# Patient Record
Sex: Male | Born: 1940 | Race: White | Hispanic: No | Smoking: Former smoker
Health system: Southern US, Community
[De-identification: ages and names within clinical notes are randomized; demographics above are authoritative.]

## PROBLEM LIST (undated history)

## (undated) DIAGNOSIS — E119 Type 2 diabetes mellitus without complications: Secondary | ICD-10-CM

## (undated) DIAGNOSIS — G2581 Restless legs syndrome: Secondary | ICD-10-CM

## (undated) DIAGNOSIS — G40909 Epilepsy, unspecified, not intractable, without status epilepticus: Secondary | ICD-10-CM

## (undated) DIAGNOSIS — J189 Pneumonia, unspecified organism: Secondary | ICD-10-CM

## (undated) DIAGNOSIS — I340 Nonrheumatic mitral (valve) insufficiency: Secondary | ICD-10-CM

## (undated) DIAGNOSIS — I4819 Other persistent atrial fibrillation: Secondary | ICD-10-CM

## (undated) DIAGNOSIS — I2510 Atherosclerotic heart disease of native coronary artery without angina pectoris: Secondary | ICD-10-CM

## (undated) DIAGNOSIS — I495 Sick sinus syndrome: Secondary | ICD-10-CM

## (undated) DIAGNOSIS — N390 Urinary tract infection, site not specified: Secondary | ICD-10-CM

## (undated) HISTORY — DX: Nonrheumatic mitral (valve) insufficiency: I34.0

## (undated) HISTORY — PX: REVISION UROSTOMY CUTANEOUS: SUR1282

## (undated) HISTORY — DX: Restless legs syndrome: G25.81

## (undated) HISTORY — DX: Pneumonia, unspecified organism: J18.9

## (undated) HISTORY — PX: OTHER SURGICAL HISTORY: SHX169

## (undated) HISTORY — DX: Urinary tract infection, site not specified: N39.0

## (undated) HISTORY — DX: Sick sinus syndrome: I49.5

## (undated) HISTORY — DX: Atherosclerotic heart disease of native coronary artery without angina pectoris: I25.10

## (undated) HISTORY — DX: Other persistent atrial fibrillation: I48.19

## (undated) HISTORY — DX: Epilepsy, unspecified, not intractable, without status epilepticus: G40.909

## (undated) HISTORY — DX: Type 2 diabetes mellitus without complications: E11.9

## (undated) HISTORY — PX: APPENDECTOMY: SHX54

## (undated) HISTORY — PX: CHOLECYSTECTOMY: SHX55

---

## 2008-06-19 ENCOUNTER — Encounter: Payer: Self-pay | Admitting: Physician Assistant

## 2008-06-19 ENCOUNTER — Ambulatory Visit: Payer: Self-pay | Admitting: Cardiology

## 2010-03-02 ENCOUNTER — Encounter: Payer: Self-pay | Admitting: Cardiology

## 2010-05-14 ENCOUNTER — Encounter: Payer: Self-pay | Admitting: Cardiology

## 2010-06-09 ENCOUNTER — Encounter: Payer: Self-pay | Admitting: Cardiology

## 2010-06-10 ENCOUNTER — Encounter: Payer: Self-pay | Admitting: Cardiology

## 2010-06-18 NOTE — Letter (Signed)
Summary: [name] - OFFICE NOTE  [name] - OFFICE NOTE   Imported By: Claudette Laws 05/26/2010 11:24:08  _____________________________________________________________________  External Attachment:    Type:   Image     Comment:   External Document

## 2010-06-18 NOTE — Letter (Signed)
Summary: [facility] - OFFICE NOTE  [facility] - OFF[facility]   Imported By: Claudette Laws 05/26/2010 11:45:55  _____________________________________________________________________  External Attachment:    Type:   Image     Comment:   External Document

## 2010-06-26 ENCOUNTER — Encounter (INDEPENDENT_AMBULATORY_CARE_PROVIDER_SITE_OTHER): Payer: Medicare Other

## 2010-06-26 ENCOUNTER — Ambulatory Visit (INDEPENDENT_AMBULATORY_CARE_PROVIDER_SITE_OTHER): Payer: Medicare Other | Admitting: Cardiology

## 2010-06-26 ENCOUNTER — Encounter: Payer: Self-pay | Admitting: Cardiology

## 2010-06-26 DIAGNOSIS — I10 Essential (primary) hypertension: Secondary | ICD-10-CM | POA: Insufficient documentation

## 2010-06-26 DIAGNOSIS — E785 Hyperlipidemia, unspecified: Secondary | ICD-10-CM | POA: Insufficient documentation

## 2010-06-26 DIAGNOSIS — Z950 Presence of cardiac pacemaker: Secondary | ICD-10-CM | POA: Insufficient documentation

## 2010-06-26 DIAGNOSIS — I4891 Unspecified atrial fibrillation: Secondary | ICD-10-CM | POA: Insufficient documentation

## 2010-06-26 DIAGNOSIS — I2510 Atherosclerotic heart disease of native coronary artery without angina pectoris: Secondary | ICD-10-CM

## 2010-06-26 DIAGNOSIS — R001 Bradycardia, unspecified: Secondary | ICD-10-CM | POA: Insufficient documentation

## 2010-06-26 DIAGNOSIS — I495 Sick sinus syndrome: Secondary | ICD-10-CM

## 2010-07-02 NOTE — Assessment & Plan Note (Signed)
Summary: NP-WANTING TO ESTABLISH W/NEW CARDIOLOGIST   Visit Type:  New patient visit Primary Provider:  Dr. Lia Hopping   History of Present Illness: 70 year old male presents to establish cardiology followup. He was previously seen by Dr. Hyacinth Meeker in [place] with history of paroxysmal atrial fibrillation managed with sotalol and Coumadin, sick sinus syndrome status post St. Jude pacemaker placement 2003, and nonobstructive CAD.  Records indicate that he was recently admitted at [facility] with an episode of paroxysmal atrial fibrillation associated with rapid ventricular response. He reportedly converted with rate control on Cardizem infusion. He had been off Coumadin for quite a while, however it was resumed and he has been on it for about a week at this point. No followup PT/INR as yet. He was also placed on much higher dose Cardizem orally.  [name] is fairly loquacious. He discussed a variety of issues regarding his health care over the last several years. It seems that he has had trouble tolerating medications in the past, specifically high-dose Cardizem, also reportedly had some trouble with Coumadin when he was taking "very high doses" and had to stop. He reports an ill-defined history of "congestive heart failure" in the past, although a recent echocardiogram done in January at [facility] is reviewed below showing normal LVEF.  He describes himself as very active, doing martial arts on a regular basis, exercising. He denies any exertional chest pain. He does report palpitations, usually on at least a once a month basis, however they are not typically prolonged enough to seek hospitalization.  He has had device interrogations done in [place], although is requesting establishment with our practice going forward, including Coumadin followup. He has been on sotalol for many years.  Preventive Screening-Counseling & Management  Alcohol-Tobacco     Smoking Status: quit     Year Started:  1949     Year Quit: 1972     Pack years: 1-1&1/2 PPD 22years  Current Medications (verified): 1)  Requip 1 Mg Tabs (Ropinirole Hcl) .... Take 1 Tablet By Mouth Once A Day 2)  Promethazine Hcl 25 Mg Tabs (Promethazine Hcl) .... As Needed 3)  Zolpidem Tartrate 10 Mg Tabs (Zolpidem Tartrate) .... Take 1 Tablet By Mouth Once A Day At [facility] 4)  Metformin Hcl 1000 Mg Tabs (Metformin Hcl) .... Take 1 Tablet By Mouth Two Times A Day 5)  Furosemide 40 Mg Tabs (Furosemide) .... Take 1 Tablet By Mouth Once A Day 6)  Cardizem Cd 300 Mg Xr24h-Cap (Diltiazem Hcl Coated Beads) .... Take 1 Tablet By Mouth Once A Day 7)  Betapace 120 Mg Tabs (Sotalol Hcl) .... Take 1 Tablet By Mouth Two Times A Day 8)  Mestinon 180 Mg Cr-Tabs (Pyridostigmine Bromide) .... Take 1 Tablet By Mouth Two Times A Day 9)  Carbamazepine 200 Mg Xr12h-Cap (Carbamazepine) .... Take 1 Tablet By Mouth Two Times A Day 10)  Glipizide Xl 5 Mg Xr24h-Tab (Glipizide) .... Take 1 Tablet By Mouth Three Times A Day 11)  Coumadin 5 Mg Tabs (Warfarin Sodium) .... Take 1 Tablet By Mouth Once A Day 12)  Tricor 145 Mg Tabs (Fenofibrate) .... Take 1 Tablet By Mouth Once A Day 13)  B Complex  Tabs (B Complex Vitamins) .... Take 1 Tablet By Mouth Two Times A Day  Allergies (verified): 1)  ! Morphine  Comments:  Nurse/Medical Assistant: The patient's medication list and allergies were reviewed with the patient and were updated in the Medication and Allergy Lists.  Past History:  Family History: Last  updated: 06/26/2010 Father: died suddenly age 78 presumed MI, history of mustard gas exposure Mother: died age 41 intestinal cancer Brother: status post CABG  Social History: Last updated: 06/26/2010 Lives in [place] Tobacco Use - Former.  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 Jude Identity dual chamber pacemaker (DDDR) 4/03 CAD - nonobstructive, catheterization [facility] 2006 Diabetes Type 2 Mild mitral regurgiation History of pneumonia and pneumothorax History of recurrent UTI  Past Surgical History: Appendectomy Cholecystectomy Bladder cancer resection  Family History: Father: died suddenly age 48 presumed MI, history of mustard gas exposure Mother: died age 53 intestinal cancer Brother: status post CABG  Social History: Lives in [place] Tobacco Use - Former.  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         No reported bleeding problems. Otherwise reviewed and negative except as outlined.  Vital Signs:  Patient profile:   70 year old male Height:      71 inches Weight:      232 pounds BMI:     32.47 Pulse rate:   76 / minute BP sitting:   152 / 83  (left arm) Cuff size:   large  Vitals Entered By: Carlye Grippe (June 26, 2010 2:29 PM)  Nutrition Counseling: Patient's BMI is greater than 25 and therefore counseled on weight management options.  Physical Exam  Additional Exam:  Obese male in no acute distress. HEENT: Conjunctiva and lids grossly normal with dysconjugate gaze, oropharynx with poor dentition. Neck: Supple, no elevated JVP or carotid bruits. Lungs: Clear but diminished breath sounds, nonlabored. Cardiac: Regular rate and rhythm, no S3 or significant murmur. Thorax: Well-healed device pocket site left. Abdomen: Soft, protuberant, nontender, bowel sounds present. Skin: Warm and dry. Musculoskeletal: No kyphosis. Extremities: No pitting edema, distal pulses full. Neuropsychiatric: Alert  and oriented x3, affect grossly appropriate.   Echocardiogram  Procedure date:  06/10/2010  Findings:      Normal LV chamber size with LVEF 60%, no focal wall motion abnormalities, aortic valve sclerosis, no other significant valvular abnormalities.  EKG  Procedure date:  06/26/2010  Findings:      Sinus rhythm at 72 beats per minutes with nonspecific T-wave changes, QTC 413 ms.  Impression & Recommendations:  Problem # 1:  ATRIAL FIBRILLATION (ICD-427.31)  Apparent long-standing history of paroxysmal atrial fibrillation, managed over the years by sotalol, previously followed by Dr. Hyacinth Meeker in [place]. Patient has been recently placed back on Coumadin, and would like to establish in our Coumadin clinic. This is being arranged today as he needs a PT INR checked. He does not report any bleeding problems at this point. QTC is normal on sotalol, and no dose adjustments will be made at this time. Depending on how frequently he manifests rapid atrial fibrillation, we may need to consider further medication adjustments versus change in antiarrhythmic therapy. He is also concerned about taking high-dose Cardizem CD, but is willing to give it a try over the next few weeks. This was initiated recently by Dr. Olena Leatherwood.  If he does  not tolerate the dose, relating progressive fatigue which he had experienced in the past, we can certainly cut it back and on  the low end. Routine follow up is scheduled for 3 months, sooner if needed.  His updated medication list for this problem includes:    Betapace 120 Mg Tabs (Sotalol hcl) .Marland Kitchen... Take 1 tablet by mouth two times a day    Coumadin 5 Mg Tabs (Warfarin sodium) .Marland Kitchen... Take 1 tablet by mouth once a day  Problem # 2:  SICK SINUS SYNDROME (ICD-427.81)  Reportedly status post St. Jude dual chamber pacemaker placement in 2003, also in [place]. He would like to establish in our device clinic and this will be arranged with ongoing follow up with Dr.  Johney Frame.  His updated medication list for this problem includes:    Cardizem Cd 300 Mg Xr24h-cap (Diltiazem hcl coated beads) .Marland Kitchen... Take 1 tablet by mouth once a day    Betapace 120 Mg Tabs (Sotalol hcl) .Marland Kitchen... Take 1 tablet by mouth two times a day    Coumadin 5 Mg Tabs (Warfarin sodium) .Marland Kitchen... Take 1 tablet by mouth once a day  His updated medication list for this problem includes:    Cardizem Cd 300 Mg Xr24h-cap (Diltiazem hcl coated beads) .Marland Kitchen... Take 1 tablet by mouth once a day    Betapace 120 Mg Tabs (Sotalol hcl) .Marland Kitchen... Take 1 tablet by mouth two times a day    Coumadin 5 Mg Tabs (Warfarin sodium) .Marland Kitchen... Take 1 tablet by mouth once a day  Problem # 3:  CARDIAC PACEMAKER IN SITU (ICD-V45.01)  Reportedly has St. Jude Identity dual-chamber pacemaker.  Problem # 4:  CORONARY ATHEROSCLEROSIS NATIVE CORONARY ARTERY (ICD-414.01)  Mild, nonobstructive at catheterization in [place] back in 2006. No active angina.  His updated medication list for this problem includes:    Cardizem Cd 300 Mg Xr24h-cap (Diltiazem hcl coated beads) .Marland Kitchen... Take 1 tablet by mouth once a day    Betapace 120 Mg Tabs (Sotalol hcl) .Marland Kitchen... Take 1 tablet by mouth two times a day    Coumadin 5 Mg Tabs (Warfarin sodium) .Marland Kitchen... Take 1 tablet by mouth once a day  His updated medication list for this problem includes:    Cardizem Cd 300 Mg Xr24h-cap (Diltiazem hcl coated beads) .Marland Kitchen... Take 1 tablet by mouth once a day    Betapace 120 Mg Tabs (Sotalol hcl) .Marland Kitchen... Take 1 tablet by mouth two times a day    Coumadin 5 Mg Tabs (Warfarin sodium) .Marland Kitchen... Take 1 tablet by mouth once a day  Problem # 5:  ESSENTIAL HYPERTENSION, BENIGN (ICD-401.1)  Blood pressure elevated today. Continue regular followup with Dr. Olena Leatherwood. Sodium restriction discussed.  His updated medication list for this problem includes:    Furosemide 40 Mg Tabs (Furosemide) .Marland Kitchen... Take 1 tablet by mouth once a day    Cardizem Cd 300 Mg Xr24h-cap (Diltiazem hcl coated  beads) .Marland Kitchen... Take 1 tablet by mouth once a day    Betapace 120 Mg Tabs (Sotalol hcl) .Marland Kitchen... Take 1 tablet by mouth two times a day  Other Orders: EKG w/ Interpretation (93000)  Patient Instructions: 1)  Follow up with Dr. Diona Browner on Friday, Sep 25, 2010 at 1pm.  2)  Your physician recommends that you continue on your current medications as directed. Please refer to the Current Medication list given to you today. Contact our office if you have problems with your medications. 3)  Return on Friday Sep 18, 2010 at 2pm to  establish with Dr. Johney Frame in our pacer clinic.

## 2010-07-02 NOTE — Consult Note (Signed)
Summary: CARDIOLOGY CONSULT/[facility]  [facility]OLOGY CONSULT/[facility]   Imported By: Zachary George 06/26/2010 09:25:55  _____________________________________________________________________  External Attachment:    Type:   Image     Comment:   External Document

## 2010-07-02 NOTE — Letter (Signed)
Summary: Discharge [facility]  Discharge [facility]   Imported By: Dorise Hiss 06/26/2010 09:34:24  _____________________________________________________________________  External Attachment:    Type:   Image     Comment:   External Document

## 2010-07-03 ENCOUNTER — Encounter (INDEPENDENT_AMBULATORY_CARE_PROVIDER_SITE_OTHER): Payer: Medicare Other

## 2010-07-03 ENCOUNTER — Encounter: Payer: Self-pay | Admitting: Cardiology

## 2010-07-03 DIAGNOSIS — I4891 Unspecified atrial fibrillation: Secondary | ICD-10-CM

## 2010-07-03 DIAGNOSIS — Z7901 Long term (current) use of anticoagulants: Secondary | ICD-10-CM

## 2010-07-08 NOTE — Letter (Signed)
Summary: Internal Other/ PATIENT HISTORY FORM  Internal Other/ PATIENT HISTORY FORM   Imported By: Dorise Hiss 07/02/2010 15:38:27  _____________________________________________________________________  External Attachment:    Type:   Image     Comment:   External Document

## 2010-07-08 NOTE — Medication Information (Signed)
Summary: ccr-lr  Anticoagulant Therapy  Managed by: Vashti Hey RN PCP: Gabrielle Dare MD: Myrtis Ser MD, Tinnie Gens Indication 1: Atrial Fibrillation Lab Used: [facility] [place] Site: [place] INR POC 1.8  Dietary changes: no    Health status changes: no    Bleeding/hemorrhagic complications: no    Recent/future hospitalizations: no    Any changes in medication regimen? no    Recent/future dental: no  Any missed doses?: no       Is patient compliant with meds? yes       Allergies: 1)  ! Morphine  Anticoagulation Management History:      The patient is taking warfarin and comes in today for a routine follow up visit.  Positive risk factors for bleeding include an age of 72 years or older and presence of serious comorbidities.  The bleeding index is 'intermediate risk'.  Positive CHADS2 values include History of HTN.  Negative CHADS2 values include Age > 45 years old.  Anticoagulation responsible provider: Myrtis Ser MD, Tinnie Gens.  INR POC: 1.8.  Cuvette Lot#: [id number].    Anticoagulation Management Assessment/Plan:      The patient's current anticoagulation dose is Coumadin 5 mg tabs: Take 1 tablet by mouth once a day.  The target INR is 2.0-3.0.  The next INR is due 07/10/2010.  Anticoagulation instructions were given to patient.  Results were reviewed/authorized by Vashti Hey RN.  He was notified by Vashti Hey RN.         Prior Anticoagulation Instructions: INR 1.2 Increase coumadin 7.5mg  once daily   Current Anticoagulation Instructions: INR 1.8 Increase coumadin to 7.5mg  once daily except 10mg  on Tuesdays and Fridays

## 2010-07-08 NOTE — Medication Information (Signed)
Summary: EST IN OUR COUMADIN CLINIC. STARTED COUMADIN LAST WEEK-JM  Lab Visit  Orders Today:  Anticoagulant Therapy  Managed by: Vashti Hey RN PCP: Gabrielle Dare MD: Diona Browner MD, Remi Deter Indication 1: Atrial Fibrillation Lab Used: [facility] [place] Site: [place] INR POC 1.2  Dietary changes: no    Health status changes: no     Recent/future hospitalizations: yes       Details: In [facility] 1/24 - 1/27 for atrial fib  Any changes in medication regimen? yes       Details: Discharged on coumadin 5mg  qd   Recent/future dental: no  Any missed doses?: no       Is patient compliant with meds? yes         Anticoagulation Management History:      The patient is taking warfarin and comes in today for a routine follow up visit.  Positive risk factors for bleeding include an age of 29 years or older and presence of serious comorbidities.  The bleeding index is 'intermediate risk'.  Positive CHADS2 values include History of HTN and History of Diabetes.  Negative CHADS2 values include Age > 56 years old.  Anticoagulation responsible provider: Diona Browner MD, Remi Deter.  INR POC: 1.2.  Cuvette Lot#: [id number].    Anticoagulation Management Assessment/Plan:      The patient's current anticoagulation dose is Coumadin 5 mg tabs: Take 1 tablet by mouth once a day.  The target INR is 2.0-3.0.  The next INR is due 07/03/2010.  Anticoagulation instructions were given to patient.  Results were reviewed/authorized by Vashti Hey RN.  He was notified by Vashti Hey RN.        Coagulation management information includes: New post hospital  D/C home on coumadin 5mg  once daily   Started on 06/15/10.  Current Anticoagulation Instructions: INR 1.2 Increase coumadin 7.5mg  once daily

## 2010-07-10 ENCOUNTER — Encounter (INDEPENDENT_AMBULATORY_CARE_PROVIDER_SITE_OTHER): Payer: Medicare Other

## 2010-07-10 ENCOUNTER — Encounter: Payer: Self-pay | Admitting: Cardiology

## 2010-07-10 ENCOUNTER — Telehealth: Payer: Self-pay | Admitting: Cardiology

## 2010-07-10 DIAGNOSIS — I4891 Unspecified atrial fibrillation: Secondary | ICD-10-CM

## 2010-07-10 DIAGNOSIS — Z7901 Long term (current) use of anticoagulants: Secondary | ICD-10-CM

## 2010-07-10 LAB — CONVERTED CEMR LAB: POC INR: 2.3

## 2010-07-14 NOTE — Medication Information (Signed)
Summary: ccr-lr  Anticoagulant Therapy  Managed by: Vashti Hey RN PCP: Gabrielle Dare MD: Diona Browner MD, Remi Deter Indication 1: Atrial Fibrillation Lab Used: [facility] [place] Site: [place] INR POC 2.3  Dietary changes: no    Health status changes: no    Bleeding/hemorrhagic complications: no    Recent/future hospitalizations: no    Any changes in medication regimen? no    Recent/future dental: no  Any missed doses?: no       Is patient compliant with meds? yes       Allergies: 1)  ! Morphine  Anticoagulation Management History:      The patient is taking warfarin and comes in today for a routine follow up visit.  Positive risk factors for bleeding include an age of 72 years or older and presence of serious comorbidities.  The bleeding index is 'intermediate risk'.  Positive CHADS2 values include History of HTN.  Negative CHADS2 values include Age > 72 years old.  Anticoagulation responsible provider: Diona Browner MD, Remi Deter.  INR POC: 2.3.  Cuvette Lot#: [id number].    Anticoagulation Management Assessment/Plan:      The patient's current anticoagulation dose is Coumadin 5 mg tabs: Take 1 tablet by mouth once a day.  The target INR is 2.0-3.0.  The next INR is due 07/21/2010.  Anticoagulation instructions were given to patient.  Results were reviewed/authorized by Vashti Hey RN.  He was notified by Vashti Hey RN.         Prior Anticoagulation Instructions: INR 1.8 Increase coumadin to 7.5mg  once daily except 10mg  on Tuesdays and Fridays  Current Anticoagulation Instructions: INR 2.3 Continue coumadin 7.5mg  once daily except 10mg  on Tuesdays and Fridays

## 2010-07-14 NOTE — Progress Notes (Signed)
Summary: Palps & not feeling well  Phone Note Call from Patient Call back at Home Phone [phone]   Caller: Patient Reason for Call: Talk to Nurse Summary of Call: patient is here in clinic to have ccr check and complaints of PALPS with not feeling well...  Initial call taken by: Claudette Laws,  July 10, 2010 10:00 AM  Follow-up for Phone Call        Pt states heart went out of rhythm last night. He didn't go to ER b/c he knew he was coming to office today. He states his heart does this all the time and he knows he is just going into a.fib. He states he felt like going on extended release Cardizem would help as he was previously on 30mg  two times a day. Pt states he is having chest pressure/pain and some SOB. Offered to do EKG. Pt refused. Notified pt he should go to ER for active chest pressure/palps. Pt refused this as well. He states he will go home. He states it usually converts on its own. If it doesn't, he states he will go to ER later but he doesn't feel like going down there and sitting right now. Follow-up by: Cyril Loosen, RN, BSN,  July 10, 2010 10:06 AM  Additional Follow-up for Phone Call Additional follow up Details #1::        Noted. Patient gone by the time I received this. Additional Follow-up by: Loreli Slot, MD, [facility],  July 10, 2010 10:26 AM

## 2010-07-21 ENCOUNTER — Encounter (INDEPENDENT_AMBULATORY_CARE_PROVIDER_SITE_OTHER): Payer: Medicare Other

## 2010-07-21 ENCOUNTER — Encounter: Payer: Self-pay | Admitting: Cardiology

## 2010-07-21 DIAGNOSIS — Z7901 Long term (current) use of anticoagulants: Secondary | ICD-10-CM

## 2010-07-21 DIAGNOSIS — I4891 Unspecified atrial fibrillation: Secondary | ICD-10-CM

## 2010-07-21 LAB — CONVERTED CEMR LAB: POC INR: 4.4

## 2010-07-28 ENCOUNTER — Encounter (INDEPENDENT_AMBULATORY_CARE_PROVIDER_SITE_OTHER): Payer: Medicare Other

## 2010-07-28 ENCOUNTER — Encounter: Payer: Self-pay | Admitting: Cardiology

## 2010-07-28 DIAGNOSIS — I4891 Unspecified atrial fibrillation: Secondary | ICD-10-CM

## 2010-07-28 DIAGNOSIS — Z7901 Long term (current) use of anticoagulants: Secondary | ICD-10-CM

## 2010-07-28 NOTE — Medication Information (Signed)
Summary: ccr-lr  Anticoagulant Therapy  Managed by: Vashti Hey RN PCP: Gabrielle Dare MD: Andee Lineman MD, Michelle Piper Indication 1: Atrial Fibrillation Lab Used: [facility] [place] Site: [place] INR POC 4.4  Dietary changes: no    Health status changes: no    Bleeding/hemorrhagic complications: no    Recent/future hospitalizations: no    Any changes in medication regimen? no    Recent/future dental: no  Any missed doses?: no       Is patient compliant with meds? yes       Allergies: 1)  ! Morphine  Anticoagulation Management History:      The patient is taking warfarin and comes in today for a routine follow up visit.  Positive risk factors for bleeding include an age of 72 years or older and presence of serious comorbidities.  The bleeding index is 'intermediate risk'.  Positive CHADS2 values include History of HTN.  Negative CHADS2 values include Age > 72 years old.  Anticoagulation responsible provider: Andee Lineman MD, Michelle Piper.  INR POC: 4.4.  Cuvette Lot#: [id number].    Anticoagulation Management Assessment/Plan:      The patient's current anticoagulation dose is Coumadin 5 mg tabs: Take 1 tablet by mouth once a day.  The target INR is 2.0-3.0.  The next INR is due 07/28/2010.  Anticoagulation instructions were given to patient.  Results were reviewed/authorized by Vashti Hey RN.  He was notified by Vashti Hey RN.         Prior Anticoagulation Instructions: INR 2.3 Continue coumadin 7.5mg  once daily except 10mg  on Tuesdays and Fridays  Current Anticoagulation Instructions: INR 4.4 Hold coumadin tonight then decrease dose to 7.5mg  once daily except 5mg  on Wednesdays and Saturdays

## 2010-08-04 NOTE — Medication Information (Signed)
Summary: ccr-lr  Anticoagulant Therapy  Managed by: Vashti Hey RN PCP: Gabrielle Dare MD: Antoine Poche MD, Fayrene Fearing Indication 1: Atrial Fibrillation Lab Used: [facility] [place] Site: [place] INR POC 2.2  Dietary changes: no    Health status changes: no    Bleeding/hemorrhagic complications: no    Recent/future hospitalizations: no    Any changes in medication regimen? no    Recent/future dental: no  Any missed doses?: no       Is patient compliant with meds? yes       Allergies: 1)  ! Morphine  Anticoagulation Management History:      The patient is taking warfarin and comes in today for a routine follow up visit.  Positive risk factors for bleeding include an age of 72 years or older and presence of serious comorbidities.  The bleeding index is 'intermediate risk'.  Positive CHADS2 values include History of HTN.  Negative CHADS2 values include Age > 80 years old.  Anticoagulation responsible provider: Antoine Poche MD, Fayrene Fearing.  INR POC: 2.2.  Cuvette Lot#: [id number].    Anticoagulation Management Assessment/Plan:      The patient's current anticoagulation dose is Coumadin 5 mg tabs: Take 1 tablet by mouth once a day.  The target INR is 2.0-3.0.  The next INR is due 08/11/2010.  Anticoagulation instructions were given to patient.  Results were reviewed/authorized by Vashti Hey RN.  He was notified by Vashti Hey RN.         Prior Anticoagulation Instructions: INR 4.4 Hold coumadin tonight then decrease dose to 7.5mg  once daily except 5mg  on Wednesdays and Saturdays  Current Anticoagulation Instructions: INR 2.2 Continue coumadin 7.5mg  except 5mg  on Wednesdays and Saturdays

## 2010-08-08 ENCOUNTER — Encounter: Payer: Self-pay | Admitting: Cardiology

## 2010-08-08 DIAGNOSIS — I4891 Unspecified atrial fibrillation: Secondary | ICD-10-CM

## 2010-08-08 DIAGNOSIS — Z7901 Long term (current) use of anticoagulants: Secondary | ICD-10-CM | POA: Insufficient documentation

## 2010-08-11 ENCOUNTER — Ambulatory Visit (INDEPENDENT_AMBULATORY_CARE_PROVIDER_SITE_OTHER): Payer: Medicare Other | Admitting: *Deleted

## 2010-08-11 DIAGNOSIS — I4891 Unspecified atrial fibrillation: Secondary | ICD-10-CM

## 2010-08-11 DIAGNOSIS — Z7901 Long term (current) use of anticoagulants: Secondary | ICD-10-CM

## 2010-08-28 ENCOUNTER — Encounter: Payer: Self-pay | Admitting: *Deleted

## 2010-08-28 ENCOUNTER — Ambulatory Visit (INDEPENDENT_AMBULATORY_CARE_PROVIDER_SITE_OTHER): Payer: Medicare Other | Admitting: *Deleted

## 2010-08-28 DIAGNOSIS — I4891 Unspecified atrial fibrillation: Secondary | ICD-10-CM

## 2010-08-28 DIAGNOSIS — Z7901 Long term (current) use of anticoagulants: Secondary | ICD-10-CM

## 2010-08-28 LAB — POCT INR: INR: 1.6

## 2010-09-15 ENCOUNTER — Ambulatory Visit (INDEPENDENT_AMBULATORY_CARE_PROVIDER_SITE_OTHER): Payer: Medicare Other | Admitting: *Deleted

## 2010-09-15 DIAGNOSIS — Z7901 Long term (current) use of anticoagulants: Secondary | ICD-10-CM

## 2010-09-15 DIAGNOSIS — I4891 Unspecified atrial fibrillation: Secondary | ICD-10-CM

## 2010-09-15 LAB — POCT INR: INR: 1.1

## 2010-09-18 ENCOUNTER — Encounter: Payer: Self-pay | Admitting: Internal Medicine

## 2010-09-18 ENCOUNTER — Encounter: Payer: Medicare Other | Admitting: Internal Medicine

## 2010-09-18 ENCOUNTER — Encounter: Payer: Medicare Other | Admitting: *Deleted

## 2010-09-24 ENCOUNTER — Encounter: Payer: Self-pay | Admitting: Cardiology

## 2010-09-25 ENCOUNTER — Encounter: Payer: Self-pay | Admitting: Cardiology

## 2010-09-25 ENCOUNTER — Ambulatory Visit (INDEPENDENT_AMBULATORY_CARE_PROVIDER_SITE_OTHER): Payer: Medicare Other | Admitting: Physician Assistant

## 2010-09-25 ENCOUNTER — Ambulatory Visit (INDEPENDENT_AMBULATORY_CARE_PROVIDER_SITE_OTHER): Payer: Medicare Other | Admitting: *Deleted

## 2010-09-25 ENCOUNTER — Encounter: Payer: Self-pay | Admitting: *Deleted

## 2010-09-25 DIAGNOSIS — Z7901 Long term (current) use of anticoagulants: Secondary | ICD-10-CM

## 2010-09-25 DIAGNOSIS — R0989 Other specified symptoms and signs involving the circulatory and respiratory systems: Secondary | ICD-10-CM

## 2010-09-25 DIAGNOSIS — Z950 Presence of cardiac pacemaker: Secondary | ICD-10-CM

## 2010-09-25 DIAGNOSIS — I4891 Unspecified atrial fibrillation: Secondary | ICD-10-CM

## 2010-09-25 DIAGNOSIS — R0600 Dyspnea, unspecified: Secondary | ICD-10-CM

## 2010-09-25 LAB — POCT INR: INR: 1.2

## 2010-09-25 NOTE — Progress Notes (Signed)
Clinical Summary [name] is a 70 y.o.male presenting for followup. He was seen as a new patient in February 2012.  Patient presents complaining of worsening abdominal girth, DOE, and easy fatigability. He was seen in the [facility] ED on May 6, for complaint of abdominal swelling. He was in normal sinus rhythm. Blood pressure 155/68, pulse 90. Potassium 4.6, creatinine 1.3, hemoglobin 1.5. Chest x-ray: No acute disease. CT of the abdomen: Fatty liver; bladder resection with ileal conduit. No significant change from previous study.  Patient is concerned that increased dose of diltiazem 300 mg daily may be contributing to the abdominal swelling. Of note, he was on this dose when last seen in the office, in February. No medication adjustments were made, at time of his recent ED visit.  Patient denies exertional CP. He continues to have occasional palpitations, with associated dyspnea. He denies orthopnea, PND, or worsening of his chronic LLE edema.  Allergies  Allergen Reactions  . Morphine     REACTION: rash    Current outpatient prescriptions:b complex vitamins tablet, Take 1 tablet by mouth 2 (two) times daily.  , Disp: , Rfl: ;  carbamazepine (CARBATROL) 200 MG 12 hr capsule, Take 200 mg by mouth 2 (two) times daily.  , Disp: , Rfl: ;  diltiazem (CARDIZEM CD) 300 MG 24 hr capsule, Take 300 mg by mouth daily.  , Disp: , Rfl: ;  fenofibrate (TRICOR) 145 MG tablet, Take 145 mg by mouth daily.  , Disp: , Rfl:  furosemide (LASIX) 40 MG tablet, Take 40 mg by mouth daily.  , Disp: , Rfl: ;  glipiZIDE (GLUCOTROL) 5 MG 24 hr tablet, Take 5 mg by mouth 3 (three) times daily.  , Disp: , Rfl: ;  metFORMIN (GLUCOPHAGE) 1000 MG tablet, Take 1,000 mg by mouth 2 (two) times daily with a meal.  , Disp: , Rfl: ;  promethazine (PHENERGAN) 25 MG tablet, Take 25 mg by mouth every 6 (six) hours as needed.  , Disp: , Rfl:  pyridostigmine (MESTINON) 180 MG CR tablet, Take 180 mg by mouth 2 (two) times daily.  , Disp: ,  Rfl: ;  rOPINIRole (REQUIP) 1 MG tablet, Take 1 mg by mouth daily.  , Disp: , Rfl: ;  sotalol (BETAPACE) 120 MG tablet, Take 120 mg by mouth 2 (two) times daily.  , Disp: , Rfl: ;  warfarin (COUMADIN) 5 MG tablet, Take by mouth as directed.  , Disp: , Rfl: ;  zolpidem (AMBIEN) 10 MG tablet, Take 10 mg by mouth at bedtime as needed.  , Disp: , Rfl:   Past Medical History  Diagnosis Date  . Paroxysmal atrial fibrillation   . Type 2 diabetes mellitus   . Seizure disorder   . Myasthenia gravis   . Restless legs syndrome (RLS)   . Sick sinus syndrome     St. Jude Identity dual chamber pacemaker (DDDR) 4/03  . Coronary atherosclerosis of native coronary artery     Nonobstructive, catheterization [facility] 2006  . Mitral regurgitation     Mild  . Pneumonia   . Pneumothorax   . Recurrent UTI     Social History [name] reports that he quit smoking about 40 years ago. His smoking use included Cigarettes. He has never used smokeless tobacco. [name] reports that he drinks alcohol.  Review of Systems The patient denies fatigue, malaise, fever, vision loss, decreased hearing, hoarseness, prolonged cough, wheezing, sleep apnea, coughing up blood, abdominal pain, blood in stool, nausea,  vomiting, diarrhea, heartburn, incontinence, blood in urine, muscle weakness, joint pain, rash, skin lesions, headache, fainting, dizziness, depression, anxiety, enlarged lymph nodes, easy bruising or bleeding, and environmental allergies.      Physical Examination There were no vitals filed for this visit. Obese male in no acute distress. HEENT: Conjunctiva and lids grossly normal with dysconjugate gaze, oropharynx with poor dentition. Neck: Supple, no elevated JVP or carotid bruits. Lungs: Clear but diminished breath sounds, nonlabored. Cardiac: Irregularly irregular, no S3 or significant murmur. Thorax: Well-healed device pocket site left. Abdomen: Soft, protuberant, nontender, bowel sounds present.  No fluid wave there Skin: Warm and dry. Musculoskeletal: No kyphosis. Extremities: 1-2+ LLE, non-pitting edema, distal pulses full. Neuropsychiatric: Alert and oriented x3, affect grossly appropriate.   ECG  Atrial fibrillation at 98 bpm; normal axis; nonspecific ST changes   Studies Echocardiogram 06/10/2010: Normal LV chamber size with LVEF 60%, no focal wall motion abnormalities, aortic valve sclerosis, no other significant valvular abnormalities.  Problem List and Plan

## 2010-09-25 NOTE — Assessment & Plan Note (Signed)
Patient complains of exertional dyspnea. There is no evidence of CHF by history or examination. Recent CXR in the ED was negative. He is on maintenance Lasix for his chronic LLE edema. He has normal LVF by recent echocardiogram, 1/12.

## 2010-09-25 NOTE — Patient Instructions (Signed)
Follow up with Dr. Diona Browner as scheduled. Follow up with Dr. Olena Leatherwood Thursday, Oct 01, 2010 at 12:30pm. Your physician recommends that you continue on your current medications as directed. Please refer to the Current Medication list given to you today. Your physician has requested that you have en exercise stress cardiolite. For further information please visit [email or web address] Please follow instruction sheet, as given.

## 2010-09-25 NOTE — Assessment & Plan Note (Signed)
Followup with Dr. Hillis Range here in our device clinic, as scheduled.

## 2010-09-25 NOTE — Assessment & Plan Note (Addendum)
Patient presents in recurrent atrial fibrillation, although he was in NSR on May 7, when he presented to the ED. He has been on Sotalol for a long time, and on increased dose of Cardizem since the beginning of this year. He is on chronic Coumadin, followed in our clinic. Following consultation with Dr. Diona Browner, plan is to screen for ischemic heart disease with an exercise stress Cardiolite. He had nonobstructive CAD by catheterization in 2006 ([place]). If the stress test is negative, we may consider him for flecainide antiarrhythmic therapy. We have also considered Tikosyn as a possible therapeutic option. Will plan early clinic follow up with myself and Dr. Diona Browner in one month, for review of test result and further recommendations. He will need repeat EKG at that time, as well.

## 2010-09-25 NOTE — Assessment & Plan Note (Signed)
An INR level will be checked today, as scheduled.

## 2010-10-01 ENCOUNTER — Encounter: Payer: Self-pay | Admitting: Internal Medicine

## 2010-10-01 ENCOUNTER — Encounter: Payer: Self-pay | Admitting: *Deleted

## 2010-10-01 ENCOUNTER — Ambulatory Visit (INDEPENDENT_AMBULATORY_CARE_PROVIDER_SITE_OTHER): Payer: Medicare Other | Admitting: Internal Medicine

## 2010-10-01 DIAGNOSIS — I4891 Unspecified atrial fibrillation: Secondary | ICD-10-CM

## 2010-10-01 DIAGNOSIS — I2510 Atherosclerotic heart disease of native coronary artery without angina pectoris: Secondary | ICD-10-CM

## 2010-10-01 DIAGNOSIS — I10 Essential (primary) hypertension: Secondary | ICD-10-CM

## 2010-10-01 DIAGNOSIS — Z950 Presence of cardiac pacemaker: Secondary | ICD-10-CM

## 2010-10-01 DIAGNOSIS — I495 Sick sinus syndrome: Secondary | ICD-10-CM

## 2010-10-01 DIAGNOSIS — Z7901 Long term (current) use of anticoagulants: Secondary | ICD-10-CM

## 2010-10-01 NOTE — Assessment & Plan Note (Signed)
Stable No change required today  

## 2010-10-01 NOTE — Patient Instructions (Addendum)
   Follow up as scheduled. Your physician recommends that you go to the [facility] for lab work: DO LABS ON MAY 24 OR 25TH.

## 2010-10-01 NOTE — Assessment & Plan Note (Signed)
Stable We will hold coumadin for 3 days prior to PPM pulse generator replacement.

## 2010-10-01 NOTE — Progress Notes (Signed)
Frederick [name] is a pleasant 70 y.o. WM patient with a h/o bradycardia sp PPM (SJM) by Dr  Hyacinth Meeker in [place] who presents today to establish care in the Electrophysiology device clinic.  He reports having multiple episodes of syncope more than 10 years ago.  He underwent surgery for bladder CA in 2003.  While in the hospital, he was observed to have multiple episodes of sinus arrest with associated syncope.  He reports having his PPM implanted by Dr Hyacinth Meeker at that time.   The patient reports doing very well since having a pacemaker implanted and remains very active despite his age.   Today, he  denies symptoms of palpitations, chest pain, orthopnea, PND, dizziness, presyncope, syncope, or neurologic sequela.  He has stable chronic edema and SOB. The patientis tolerating medications without difficulties and is otherwise without complaint today.   Past Medical History  Diagnosis Date  . Paroxysmal atrial fibrillation   . Type 2 diabetes mellitus   . Seizure disorder   . Myasthenia gravis   . Restless legs syndrome (RLS)   . Sick sinus syndrome     St. Jude Identity dual chamber pacemaker (DDDR) 4/03  . Coronary atherosclerosis of native coronary artery     Nonobstructive, catheterization [facility] 2006  . Mitral regurgitation     Mild  . Pneumonia   . Pneumothorax   . Recurrent UTI     Past Surgical History  Procedure Date  . Appendectomy   . Cholecystectomy   . Bladder cancer resection   . St. jude identity dual chamber pacemaker (dddr) 4/03     04/03 by Dr Hyacinth Meeker in [place]    History   Social History  . Marital Status: Widowed    Spouse Name: N/A    Number of Children: N/A  . Years of Education: N/A   Occupational History  . Retired    Social History Main Topics  . Smoking status: Former Smoker -- 1.5 packs/day for 22 years    Types: Cigarettes    Quit date: 05/17/1970  . Smokeless tobacco: Never Used   Comment:  Pack years: 1-1&1/2 PPD 22years  . Alcohol Use:  Yes     Rare occassions  . Drug Use: No  . Sexually Active: Not on file   Other Topics Concern  . Not on file   Social History Narrative   Lives in [place]    No family history on file.  Allergies  Allergen Reactions  . Morphine     REACTION: rash    Current Outpatient Prescriptions  Medication Sig Dispense Refill  . b complex vitamins tablet Take 1 tablet by mouth 2 (two) times daily.        . carbamazepine (CARBATROL) 200 MG 12 hr capsule Take 200 mg by mouth 2 (two) times daily.        Marland Kitchen diltiazem (CARDIZEM CD) 300 MG 24 hr capsule Take 300 mg by mouth daily.        . fenofibrate (TRICOR) 145 MG tablet Take 145 mg by mouth daily.        . furosemide (LASIX) 40 MG tablet Take 40 mg by mouth daily.       Marland Kitchen glipiZIDE (GLUCOTROL) 5 MG 24 hr tablet Take 5 mg by mouth 3 (three) times daily.        . metFORMIN (GLUCOPHAGE) 1000 MG tablet Take 1,000 mg by mouth 2 (two) times daily with a meal.        . Omega-3  Fatty Acids (FISH OIL) 1000 MG CAPS Take 1 capsule by mouth 2 (two) times daily.        . promethazine (PHENERGAN) 25 MG tablet Take 25 mg by mouth every 6 (six) hours as needed.        . pyridostigmine (MESTINON) 180 MG CR tablet Take 180 mg by mouth 2 (two) times daily.       Marland Kitchen rOPINIRole (REQUIP) 1 MG tablet Take 1 mg by mouth daily.        . sotalol (BETAPACE) 120 MG tablet Take 120 mg by mouth 2 (two) times daily.        Marland Kitchen warfarin (COUMADIN) 5 MG tablet Take by mouth as directed.        . zolpidem (AMBIEN) 10 MG tablet Take 10 mg by mouth at bedtime as needed.          ROS- all systems are reviewed and negative except as per HPI  Physical Exam: Filed Vitals:   10/01/10 1536  BP: 152/80  Pulse: 76  Height: 5\' 11"  (1.803 m)  Weight: 242 lb (109.77 kg)    GEN- The patient is well appearing, alert and oriented x 3 today.   Head- normocephalic, atraumatic Eyes-  Sclera clear, conjunctiva pink Ears- hearing intact Oropharynx- clear Neck- supple, no  JVP Lymph- no cervical lymphadenopathy Lungs- Clear to ausculation bilaterally, normal work of breathing Chest- pacemaker pocket is well healed Heart- Regular rate and rhythm, no murmurs, rubs or gallops, PMI not laterally displaced GI- soft, NT, ND, + BS Extremities- no clubbing, cyanosis, 2+ chronic edema MS- no significant deformity or atrophy Skin- no rash or lesion Psych- euthymic mood, full affect Neuro- strength and sensation are intact  Pacemaker interrogation- reviewed in detail today,  See PACEART report  Assessment and Plan:

## 2010-10-01 NOTE — Assessment & Plan Note (Signed)
He is S/p PPM (SJM) by Dr Hyacinth Meeker 2003 for sinus pauses with syncope.  These episodes appear to have been in the setting of post op recovery from surgery and may have been vagal in origin.  The patient is happy to have had no further syncope since having his pacemaker implanted.  He has now reached ERI, though his device is otherwise functioning appropriately.  He is not device dependant.  Today, we had a long discussion regarding the implications of possible vagally mediated sinus node dysfunction/ pauses post operatively.  Though I cannot guarantee that he would not have syncope without a pacemaker, I am also not completely convinced that he would require one going forward.  We had a long discussion today regarding risks, benefits, and alternatives to pacemaker pulse generator replacement.  The pt is very clear that he would like to have his pulse generator replaced.  I think that this is a very reasonable option.   We will therefore plan PPM pulse generator replacement at the next available time.

## 2010-10-02 ENCOUNTER — Ambulatory Visit (INDEPENDENT_AMBULATORY_CARE_PROVIDER_SITE_OTHER): Payer: Medicare Other | Admitting: *Deleted

## 2010-10-02 DIAGNOSIS — Z7901 Long term (current) use of anticoagulants: Secondary | ICD-10-CM

## 2010-10-02 DIAGNOSIS — I4891 Unspecified atrial fibrillation: Secondary | ICD-10-CM

## 2010-10-02 LAB — POCT INR: INR: 1.7

## 2010-10-05 ENCOUNTER — Telehealth: Payer: Self-pay | Admitting: *Deleted

## 2010-10-05 ENCOUNTER — Other Ambulatory Visit: Payer: Self-pay | Admitting: *Deleted

## 2010-10-05 MED ORDER — WARFARIN SODIUM 5 MG PO TABS: ORAL_TABLET | ORAL | Status: DC

## 2010-10-05 NOTE — Telephone Encounter (Signed)
Checking percert for PPM pulse generator replacement No date set yet per Dr. Johney Frame

## 2010-10-05 NOTE — Telephone Encounter (Signed)
NO PRECERT REQ FOR GENERATOR CHANGEOUT FOR MEDICARE/MEDICAID

## 2010-10-08 ENCOUNTER — Encounter: Payer: Self-pay | Admitting: Internal Medicine

## 2010-10-08 LAB — PROTIME-INR

## 2010-10-13 DIAGNOSIS — R072 Precordial pain: Secondary | ICD-10-CM

## 2010-10-15 ENCOUNTER — Ambulatory Visit (HOSPITAL_COMMUNITY)
Admission: RE | Admit: 2010-10-15 | Discharge: 2010-10-15 | Disposition: A | Discharge status: Home / Self Care | Payer: Medicare Other | Source: Ambulatory Visit | Attending: Internal Medicine | Admitting: Internal Medicine

## 2010-10-15 DIAGNOSIS — I4891 Unspecified atrial fibrillation: Secondary | ICD-10-CM | POA: Insufficient documentation

## 2010-10-15 DIAGNOSIS — I10 Essential (primary) hypertension: Secondary | ICD-10-CM | POA: Insufficient documentation

## 2010-10-15 DIAGNOSIS — I2510 Atherosclerotic heart disease of native coronary artery without angina pectoris: Secondary | ICD-10-CM | POA: Insufficient documentation

## 2010-10-15 DIAGNOSIS — Z45018 Encounter for adjustment and management of other part of cardiac pacemaker: Secondary | ICD-10-CM | POA: Insufficient documentation

## 2010-10-15 DIAGNOSIS — I495 Sick sinus syndrome: Secondary | ICD-10-CM

## 2010-10-15 LAB — GLUCOSE, CAPILLARY: Glucose-Capillary: 199 mg/dL — ABNORMAL HIGH (ref 70–99)

## 2010-10-15 LAB — PROTIME-INR
INR: 1.1 (ref 0.00–1.49)
Prothrombin Time: 14.4 seconds (ref 11.6–15.2)

## 2010-10-15 LAB — SURGICAL PCR SCREEN
MRSA, PCR: NEGATIVE
Staphylococcus aureus: NEGATIVE

## 2010-10-20 ENCOUNTER — Ambulatory Visit (INDEPENDENT_AMBULATORY_CARE_PROVIDER_SITE_OTHER): Payer: Medicare Other | Admitting: *Deleted

## 2010-10-20 DIAGNOSIS — I4891 Unspecified atrial fibrillation: Secondary | ICD-10-CM

## 2010-10-20 DIAGNOSIS — Z7901 Long term (current) use of anticoagulants: Secondary | ICD-10-CM

## 2010-10-22 ENCOUNTER — Encounter: Payer: Self-pay | Admitting: Internal Medicine

## 2010-10-22 ENCOUNTER — Ambulatory Visit (INDEPENDENT_AMBULATORY_CARE_PROVIDER_SITE_OTHER): Payer: Medicare Other | Admitting: *Deleted

## 2010-10-22 VITALS — BP 151/82 | HR 78 | Ht 71.0 in | Wt 238.0 lb

## 2010-10-22 DIAGNOSIS — I495 Sick sinus syndrome: Secondary | ICD-10-CM

## 2010-10-22 NOTE — Progress Notes (Signed)
Wound check pacer 

## 2010-10-26 ENCOUNTER — Encounter: Payer: Self-pay | Admitting: *Deleted

## 2010-10-26 ENCOUNTER — Encounter: Payer: Self-pay | Admitting: Cardiology

## 2010-10-26 ENCOUNTER — Ambulatory Visit (INDEPENDENT_AMBULATORY_CARE_PROVIDER_SITE_OTHER): Payer: Medicare Other | Admitting: Cardiology

## 2010-10-26 ENCOUNTER — Telehealth: Payer: Self-pay | Admitting: *Deleted

## 2010-10-26 VITALS — BP 164/82 | HR 85 | Ht 71.0 in | Wt 239.0 lb

## 2010-10-26 DIAGNOSIS — I4891 Unspecified atrial fibrillation: Secondary | ICD-10-CM

## 2010-10-26 DIAGNOSIS — Z950 Presence of cardiac pacemaker: Secondary | ICD-10-CM

## 2010-10-26 DIAGNOSIS — I2510 Atherosclerotic heart disease of native coronary artery without angina pectoris: Secondary | ICD-10-CM

## 2010-10-26 DIAGNOSIS — R0602 Shortness of breath: Secondary | ICD-10-CM

## 2010-10-26 DIAGNOSIS — R9430 Abnormal result of cardiovascular function study, unspecified: Secondary | ICD-10-CM

## 2010-10-26 NOTE — Assessment & Plan Note (Signed)
Reportedly nonobstructive as of 2006, based on cardiac catheterization with Dr. Hyacinth Meeker in [place]. Further assessment pending to exclude significant progression.

## 2010-10-26 NOTE — Assessment & Plan Note (Signed)
Recent St. Jude generator change by Dr. Johney Frame in late May. Patient will need to maintain regular followup for device interrogation.

## 2010-10-26 NOTE — Telephone Encounter (Signed)
Pt has Medicare and Medicaid, no precert required.  Frederick [name]

## 2010-10-26 NOTE — Progress Notes (Signed)
Clinical Summary [name] is a 70 y.o.male presenting for followup. He was seen recently by [name] in mid-May at which time a Cardiolite was arranged for ischemic surveillance. He then followed up with Dr. Johney [name] and underwent discussion regarding pacemaker generator replacement given ERI. Records indicate that this was accomplished on May 31. A. St. Jude Accent model was placed. Cardiolite results are noted below.  Today I discussed the Cardiolite results with [name], as well as the implications. He is quite loquacious, tending to monopolize the conversation, and changing topics. He complains of dyspnea on exertion that is  functionally limiting, although no frank angina. Cardiac catheterization from 2006 reportedly revealed no major obstructive stenoses. The possibility of progressive CAD is to be considered in light of his Cardiolite findings, although he may be limited by frequent episodes of paroxysmal atrial fibrillation as well.  He does complain of rapid palpitations, and continues to voice concerns about diltiazem at the present dose, feeling that it makes him "feel bad." He reports compliance with his Coumadin. States he recently underwent an esophageal dilatation procedure in [name], and Coumadin had to be held. His most recent INR was 1.0 on June 5.  He also states that he thinks he can "feel" his pacemaker when it functions. He reportedly had problems with this in the past with Dr. Hyacinth [name] in [place].  Today I outlined a number of options as best I could with [name]. He is not satisfied with his current symptom management. We discussed proceeding with a diagnostic cardiac catheterization, off Coumadin, to best understand his coronary anatomy and rule out significant progression of CAD that may be symptom provoking. Alternatively we could manage him medically for CAD and consider a change in antiarrhythmic therapy from sotalol. Another option would be to adopt a strategy of  heart rate control, continuing Coumadin, and discontinue sotalol altogether. After reviewing these options, and discussing the risks and potential benefits, plan is to arrange a cardiac catheterization as noted, and then depending on the results, discuss a different antiarrhythmic agent with switch from sotalol. He voiced the most comfort with this option.   Allergies  Allergen Reactions  . Morphine     REACTION: rash    Current outpatient prescriptions:b complex vitamins tablet, Take 1 tablet by mouth 2 (two) times daily.  , Disp: , Rfl: ;  carbamazepine (CARBATROL) 200 MG 12 hr capsule, Take 200 mg by mouth 2 (two) times daily.  , Disp: , Rfl: ;  diltiazem (CARDIZEM CD) 300 MG 24 hr capsule, Take 300 mg by mouth daily.  , Disp: , Rfl: ;  fenofibrate (TRICOR) 145 MG tablet, Take 145 mg by mouth daily.  , Disp: , Rfl:  furosemide (LASIX) 40 MG tablet, Take 40 mg by mouth daily. , Disp: , Rfl: ;  glipiZIDE (GLUCOTROL) 5 MG 24 hr tablet, Take 5 mg by mouth 3 (three) times daily.  , Disp: , Rfl: ;  metFORMIN (GLUCOPHAGE) 1000 MG tablet, Take 1,000 mg by mouth 2 (two) times daily with a meal.  , Disp: , Rfl: ;  nystatin-triamcinolone (MYCOLOG II) cream, Apply 1 application topically 2 (two) times daily as needed.  , Disp: , Rfl:  Omega-3 Fatty Acids (FISH OIL) 1000 MG CAPS, Take 1 capsule by mouth 2 (two) times daily.  , Disp: , Rfl: ;  promethazine (PHENERGAN) 25 MG tablet, Take 25 mg by mouth every 6 (six) hours as needed.  , Disp: , Rfl: ;  pyridostigmine (MESTINON) 180 MG  CR tablet, Take 180 mg by mouth 2 (two) times daily. , Disp: , Rfl: ;  rOPINIRole (REQUIP) 1 MG tablet, Take 1 mg by mouth daily.  , Disp: , Rfl:  sotalol (BETAPACE) 120 MG tablet, Take 120 mg by mouth 2 (two) times daily.  , Disp: , Rfl: ;  warfarin (COUMADIN) 5 MG tablet, Take coumadin 1 1/2 tablets daily except 1 tablet on Mondays and Thursdays, Disp: 45 tablet, Rfl: 3;  zolpidem (AMBIEN) 10 MG tablet, Take 10 mg by mouth at bedtime  as needed.  , Disp: , Rfl:   Past Medical History  Diagnosis Date  . Paroxysmal atrial fibrillation   . Type 2 diabetes mellitus   . Seizure disorder   . Myasthenia gravis   . Restless legs syndrome (RLS)   . Sick sinus syndrome     St. Jude Identity dual chamber pacemaker (DDDR) 4/03  . Coronary atherosclerosis of native coronary artery     Nonobstructive, catheterization [facility] 2006  . Mitral regurgitation     Mild  . Pneumonia   . Pneumothorax   . Recurrent UTI     Past Surgical History  Procedure Date  . Appendectomy   . Cholecystectomy   . Bladder cancer resection   . St. jude identity dual chamber pacemaker (dddr) 4/03     04/03 by Dr [name] in [place]    Family History  Problem Relation Age of Onset  . Coronary artery disease Brother     Social History [name] reports that he quit smoking about 40 years ago. His smoking use included Cigarettes. He has a 33 pack-year smoking history. He has never used smokeless tobacco. Frederick [name] reports that he drinks alcohol.  Review of Systems As outlined above. No reported bleeding problems. Problems with dysphagia that seem to be chronic. Also reflux symptoms. Otherwise negative.  Physical Examination Filed Vitals:   10/26/10 1421  BP: 164/82  Pulse: 85   Obese male in no acute distress.  HEENT: Conjunctiva and lids grossly normal with dysconjugate gaze, oropharynx with poor dentition.  Neck: Supple, no elevated JVP or carotid bruits.  Lungs: Clear but diminished breath sounds, nonlabored.  Cardiac: Irregularly irregular, no S3 or significant murmur.  Thorax: Well-healed device pocket site left.  Abdomen: Soft, protuberant, nontender, bowel sounds present. No fluid wave there  Skin: Warm and dry.  Musculoskeletal: No kyphosis.  Extremities: 1-2+ LLE, non-pitting edema, distal pulses full.  Neuropsychiatric: Alert and oriented x3, affect grossly appropriate.   ECG Atrial fibrillation at 86 beats per  minute with occasional paced beat, nonspecific ST-T changes.  Studies Lexiscan Cardiolite 10/13/2010: No chest pain or diagnostic ST segment changes. Fixed, small, mid to basal inferior defect suggesting area of scar, LVEF 53%.  Problem List and Plan

## 2010-10-26 NOTE — Assessment & Plan Note (Signed)
Progressive dyspnea on exertion, describing NYHA class III symptoms, although no clear-cut exertional chest pain. In light of his Cardiolite results, the possibility of progressive ischemic heart disease is to be considered, although certainly paroxysmal symptomatic atrial fibrillation may be playing a role as well. After discussion, noted above, plan is to proceed with a diagnostic right and left heart catheterization, off Coumadin, to clearly assess hemodynamics and coronary anatomy. Depending on results, further plans can be grafted, with an eye toward picking a different antiarrhythmic most likely.

## 2010-10-26 NOTE — Assessment & Plan Note (Signed)
Paroxysmal, likely symptomatic. He is on Coumadin and sotalol, although it does not appear that he has very good rhythm control. Following his cardiac catheterization, can determine potentially switching antiarrhythmic therapy, likely via consultation with Dr. Johney Frame.

## 2010-10-26 NOTE — Op Note (Signed)
NAMESAMAY, DELCARLO              ACCOUNT NO.:  [id number]  MEDICAL RECORD NO.:  [id number]           PATIENT TYPE:  O  LOCATION:  [facility]                         FACILITY:  [facility]  PHYSICIAN:  Hillis Range, MD       DATE OF BIRTH:  [birth year]  DATE OF PROCEDURE:  10/15/2010 DATE OF DISCHARGE:  10/15/2010                              OPERATIVE REPORT   SURGEON:  Hillis Range, MD  PREPROCEDURE DIAGNOSES: 1. Sick sinus syndrome with prior syncope. 2. Paroxysmal atrial fibrillation.  POSTPROCEDURE DIAGNOSES: 1. Sick sinus syndrome with prior syncope. 2. Paroxysmal atrial fibrillation.  PROCEDURES: 1. Pacemaker with skin pocket revision. 2. Pacemaker pulse generator replacement.  INTRODUCTION:  Frederick [name] is a 70 year old gentleman with a history of bradycardia status post pacemaker implantation by Dr. Hyacinth Meeker in [place] in 2003, who now presents for pacemaker pulse generator replacement.  He states that he had multiple episodes of syncope more than 10 years ago. He was evaluated by Dr. Hyacinth Meeker and found to have sick sinus syndrome with multiple prolonged pauses.  He therefore underwent pacemaker implantation.  He reports doing very well without any further episodes of syncope since that time.  His pacemaker has now reached elective replacement indicator.  He therefore presents today for pacemaker pulse generator replacement.  DESCRIPTION OF PROCEDURE:  Informed written consent was obtained and the patient was brought to the electrophysiology lab in the fasting state. He was adequately sedated with intravenous fentanyl as outlined in the nursing report.  His pacemaker was interrogated and confirmed to be at [facility] battery status.  His left chest was prepped and draped in the usual sterile fashion by the EP lab staff.  The skin overlying his pacemaker site was infiltrated with lidocaine for local analgesia.  A 3-cm incision was made over the existing pacemaker pocket.  Using  a combination of sharp and blunt dissection, the pacemaker was exposed and removed from the pocket.  There was no foreign matter or debris within the pocket.  The pocket was then revised to accommodate the new device which was larger in size.  Electrocautery was required to assure hemostasis.  The leads were disconnected from the device and the device was removed from the pocket.  The atrial lead and ventricular lead were both inspected carefully and their integrity was confirmed to be intact. The atrial lead was a model 1342T-46 (serial [id number]) lead implanted on September 08, 2001.  P-waves measured 2 mV with impedance of 410 ohms and a threshold of 0.75 volts at 0.4 milliseconds.  The patient presented in sinus rhythm today.  The right ventricular lead was a model 1346T-52 (serial [id number]) lead implanted on September 08, 2001.  R-waves measured 5.2 mV with an impedance of 480 ohms and a threshold of 0.75 volts at 0.4 milliseconds.  Both leads were therefore secured to a [employer] Jude [employer] DR RF, model [id number] (serial [id number]) pacemaker.  The pocket was irrigated with copious gentamicin solution.  The pacemaker was then placed into the pocket.  The pocket was then closed in 2 layers with 2.0 Vicryl suture for the subcutaneous and subcuticular layers. Steri-Strips  and a sterile dressing were then applied.  There were no early apparent complications.  CONCLUSIONS: 1. Successful pacemaker pulse generator replacement for ERI battery     status. 2. No early apparent complications.     Hillis Range, MD     JA/MEDQ  D:  10/15/2010  T:  10/15/2010  Job:  [id number]  cc:   Ross Marcus, MD  Electronically Signed by Hillis Range MD on 10/26/2010 09:19:26 AM

## 2010-10-26 NOTE — Telephone Encounter (Signed)
(  R) and (L) heart cath [facility] lab Tuesday, November 03, 2010 Arrive at 10:30 for 11:30 case Dr. Clifton James  Check on Pre-cert

## 2010-10-26 NOTE — Patient Instructions (Addendum)
Your physician recommends that you go to the [facility] for lab work/chest x-ray. DO TOMORROW, 6/12 OR WED 6/13 Come to [facility] office for INR check on Monday, November 02, 2010 at 8:30am Your physician has requested that you have a cardiac catheterization. Cardiac catheterization is used to diagnose and/or treat various heart conditions. Doctors may recommend this procedure for a number of different reasons. The most common reason is to evaluate chest pain. Chest pain can be a symptom of coronary artery disease (CAD), and cardiac catheterization can show whether plaque is narrowing or blocking your heart's arteries. This procedure is also used to evaluate the valves, as well as measure the blood flow and oxygen levels in different parts of your heart. For further information please visit [email or web address] Please follow instruction sheet, as given. Your physician recommends that you continue on your current medications as directed. Please refer to the Current Medication list given to you today.

## 2010-10-28 ENCOUNTER — Ambulatory Visit: Payer: Medicare Other | Admitting: *Deleted

## 2010-10-29 ENCOUNTER — Telehealth: Payer: Self-pay | Admitting: *Deleted

## 2010-10-29 NOTE — Telephone Encounter (Signed)
Pt set for cath on 6/19. He took his last dose of Coumadin on 6/13. He had appt for CCR visit on 6/18 to insure that INR was at acceptable level for cath. Per 6/13 lab work at [facility], pt's INR is 1.3. Pt notified that 6/18 CCR visit is not needed. This appt was r/s to 6/26 for post cath INR visit.

## 2010-10-30 ENCOUNTER — Ambulatory Visit: Payer: Medicare Other | Admitting: *Deleted

## 2010-10-30 ENCOUNTER — Encounter: Payer: Medicare Other | Admitting: *Deleted

## 2010-10-30 DIAGNOSIS — R079 Chest pain, unspecified: Secondary | ICD-10-CM

## 2010-11-02 ENCOUNTER — Encounter: Payer: Medicare Other | Admitting: *Deleted

## 2010-11-03 ENCOUNTER — Inpatient Hospital Stay (HOSPITAL_BASED_OUTPATIENT_CLINIC_OR_DEPARTMENT_OTHER)
Admission: RE | Admit: 2010-11-03 | Discharge: 2010-11-03 | Disposition: A | Discharge status: Home / Self Care | Payer: Medicare Other | Source: Ambulatory Visit | Attending: Cardiovascular Disease | Admitting: Cardiovascular Disease

## 2010-11-03 DIAGNOSIS — I4891 Unspecified atrial fibrillation: Secondary | ICD-10-CM | POA: Insufficient documentation

## 2010-11-03 DIAGNOSIS — I2510 Atherosclerotic heart disease of native coronary artery without angina pectoris: Secondary | ICD-10-CM | POA: Insufficient documentation

## 2010-11-03 DIAGNOSIS — E119 Type 2 diabetes mellitus without complications: Secondary | ICD-10-CM | POA: Insufficient documentation

## 2010-11-03 DIAGNOSIS — R9430 Abnormal result of cardiovascular function study, unspecified: Secondary | ICD-10-CM

## 2010-11-03 DIAGNOSIS — Z950 Presence of cardiac pacemaker: Secondary | ICD-10-CM | POA: Insufficient documentation

## 2010-11-04 LAB — POCT I-STAT 3, ART BLOOD GAS (G3+)
Acid-base deficit: 3 mmol/L — ABNORMAL HIGH (ref 0.0–2.0)
Bicarbonate: 21.9 mEq/L (ref 20.0–24.0)
TCO2: 23 mmol/L (ref 0–100)

## 2010-11-04 LAB — POCT I-STAT 3, VENOUS BLOOD GAS (G3P V): Acid-base deficit: 3 mmol/L — ABNORMAL HIGH (ref 0.0–2.0)

## 2010-11-05 NOTE — Cardiovascular Report (Signed)
[name], [name] NO.:  [id number]  MEDICAL RECORD NO.:  [id number]  LOCATION:                                 FACILITY:  PHYSICIAN:  Verne Carrow, MDDATE OF BIRTH:  10-18-1940  DATE OF PROCEDURE:  11/03/2010 DATE OF DISCHARGE:                           CARDIAC CATHETERIZATION   PRIMARY CARDIOLOGIST:  Jonelle Sidle, MD  PROCEDURES PERFORMED: 1. Left heart catheterization. 2. Right heart catheterization. 3. Selective coronary angiography. 4. Left ventricular angiogram.  OPERATOR:  Verne Carrow, MD  INDICATIONS:  This is a 70 year old Caucasian male with history of paroxysmal atrial fibrillation, diabetes mellitus, sick sinus syndrome status post pacemaker, and mild nonobstructive coronary artery disease by cath in 2006, who had an abnormal stress Myoview, suggesting possible ischemia in the mid-to-basal inferior wall.  Diagnostic catheterization was arranged by Dr. Diona Browner.  DETAILS OF PROCEDURE:  The patient was brought into the outpatient cardiac catheterization laboratory after signing informed consent for the procedure.  The right groin was prepped and draped in sterile fashion.  A 1% lidocaine was used for local anesthesia.  A 4-French sheath was inserted into the right femoral artery without difficulty.  A 6-French sheath was inserted into the right femoral vein without difficulty.  A right heart catheterization was performed with a multipurpose catheter.  Selective coronary angiography was performed with standard diagnostic catheters.  All catheter exchanges were performed over a wire.  A pigtail catheter was used to perform a left ventricular angiogram.  The patient tolerated the procedure well with no immediate complications.  The patient was taken to the recovery area in stable condition.  HEMODYNAMIC FINDINGS:  Central aortic pressure 154/66.  Left ventricular pressure 159/21/24.  Right atrial pressure 9.  Right  ventricular pressure 39/8/11.  Pulmonary artery pressure 36/11 with a mean of 22. Pulmonary capillary wedge pressure mean of 13.  Central aortic saturation 96%.  Pulmonary artery saturation 64%.  Cardiac output 5.5 liters per minute.  Cardiac index 2.4 liters per minute per meter squared.  ANGIOGRAPHIC FINDINGS: 1. The left main coronary artery had mild plaque disease. 2. The left anterior descending was a large vessel that coursed to the     apex and gave off 3 very small-caliber diagonal branches.  There     was no obstructive disease noted in this vessel. 3. The ramus intermediate is a moderate-to-large sized bifurcating     vessel with no obstructive disease. 4. The circumflex artery is small-to-moderate sized vessel that     courses to the AV groove and gives off 2 very small-caliber obtuse     marginal branches.  There was no obstructive disease noted in this     vessel. 5. The right coronary artery is a large dominant vessel with mild 10-     20% plaque in the mid vessel. 6. The left ventricular angiogram was performed in the RAO projection     and shows hypokinesis of the inferior wall with overall ejection     fraction of 40-45%.  IMPRESSION: 1. Mild nonobstructive coronary artery disease. 2. Mild left ventricular systolic dysfunction.  RECOMMENDATIONS:  I recommend continued medical management at this point.     Cristal Deer  Clifton James, MD     CM/MEDQ  D:  11/03/2010  T:  11/04/2010  Job:  [id number]  cc:   Jonelle Sidle, MD  Electronically Signed by Verne Carrow MD on 11/05/2010 09:02:15 AM

## 2010-11-10 ENCOUNTER — Ambulatory Visit (INDEPENDENT_AMBULATORY_CARE_PROVIDER_SITE_OTHER): Payer: Medicare Other | Admitting: *Deleted

## 2010-11-10 DIAGNOSIS — I4891 Unspecified atrial fibrillation: Secondary | ICD-10-CM

## 2010-11-10 DIAGNOSIS — Z7901 Long term (current) use of anticoagulants: Secondary | ICD-10-CM

## 2010-11-10 LAB — POCT INR: INR: 1.5

## 2010-11-24 ENCOUNTER — Encounter: Payer: Self-pay | Admitting: Cardiology

## 2010-11-26 ENCOUNTER — Ambulatory Visit (INDEPENDENT_AMBULATORY_CARE_PROVIDER_SITE_OTHER): Payer: Medicare Other | Admitting: Physician Assistant

## 2010-11-26 ENCOUNTER — Ambulatory Visit (INDEPENDENT_AMBULATORY_CARE_PROVIDER_SITE_OTHER): Payer: Medicare Other | Admitting: *Deleted

## 2010-11-26 ENCOUNTER — Encounter: Payer: Self-pay | Admitting: Cardiology

## 2010-11-26 DIAGNOSIS — I10 Essential (primary) hypertension: Secondary | ICD-10-CM

## 2010-11-26 DIAGNOSIS — E1149 Type 2 diabetes mellitus with other diabetic neurological complication: Secondary | ICD-10-CM | POA: Insufficient documentation

## 2010-11-26 DIAGNOSIS — I4891 Unspecified atrial fibrillation: Secondary | ICD-10-CM

## 2010-11-26 DIAGNOSIS — E119 Type 2 diabetes mellitus without complications: Secondary | ICD-10-CM

## 2010-11-26 DIAGNOSIS — Z7901 Long term (current) use of anticoagulants: Secondary | ICD-10-CM

## 2010-11-26 DIAGNOSIS — I2510 Atherosclerotic heart disease of native coronary artery without angina pectoris: Secondary | ICD-10-CM

## 2010-11-26 LAB — POCT INR: INR: 1.4

## 2010-11-26 MED ORDER — DILTIAZEM HCL ER COATED BEADS 360 MG PO CP24: 360.0000 mg | ORAL_CAPSULE | Freq: Every day | ORAL | Status: DC

## 2010-11-26 NOTE — Assessment & Plan Note (Signed)
Followed by Dr. Hasanaj. 

## 2010-11-26 NOTE — Assessment & Plan Note (Signed)
Recently documented mild nonobstructive CAD with mild LVD (EF 40-45%). Will order a 2-D echo for reassessment of LVF, given this recent finding. Of note, a 2-D echo here at [facility], this past January, yielded an EF of 60% with no focal wall motion abnormalities.

## 2010-11-26 NOTE — Patient Instructions (Signed)
Your physician wants you to follow-up in: 6 months. You will receive a reminder letter in the mail one-two months in advance. If you don't receive a letter, please call our office to schedule the follow-up appointment. Stop Norvasc (amlodipine). Increase Cardizem (diltiazem) to 360 mg daily. Stop the 300 mg and start new prescription for 360 mg. Your physician has requested that you have an echocardiogram. Echocardiography is a painless test that uses sound waves to create images of your heart. It provides your doctor with information about the size and shape of your heart and how well your heart's chambers and valves are working. This procedure takes approximately one hour. There are no restrictions for this procedure. Follow up with Dr. Johney Frame as scheduled.

## 2010-11-26 NOTE — Assessment & Plan Note (Addendum)
Following consultation Dr. Diona Browner, we will defer to Dr. Johney Frame regarding reassessment of current antiarrhythmic regimen. In light of recent catheterization results, consideration may be given to substituting sotalol with Tikosyn, given that patient appears to have failed treatment with sotalol. Also discussed option of adding digoxin, but would prefer not to, given underlying renal insufficiency. The patient is to remain on Coumadin anticoagulation long-term, followed here in our clinic. He informed me today that he was recently told that he had suffered an occult stroke in the past, based on an incidental finding by CT imaging, during a recent ER visit.

## 2010-11-26 NOTE — Progress Notes (Signed)
HPI: Patient presents for post catheterization followup, scheduled by Dr. Diona Browner at time of last office visit, June 11, and following recent Cardiolite result suggesting a small fixed inferobasal defect; EF 53%. Patient had been previously studied in 2006, in [place], and found to have nonobstructive CAD.  Cardiac catheterization yielded mild, nonobstructive CAD with mild LVD (EF 40-45%), with inferior wall HK.  Clinically, patient denies any exertional chest pain, and states he's never had any. He does, however, have occasional "twinges", overlying his pacer site. He denies any complications of the right groin incision sites.  He also maintains that he has been in and out of atrial fibrillation, since undergoing a recent pacemaker generator replacement. He remains on chronic Coumadin, followed here in our clinic.  Allergies  Allergen Reactions  . Flexeril (Cyclobenzaprine Hcl)     Can not take any muscle relaxers due to having myasthenia gravis per patient.  . Morphine     REACTION: rash    Current Outpatient Prescriptions on File Prior to Visit  Medication Sig Dispense Refill  . b complex vitamins tablet Take 1 tablet by mouth 2 (two) times daily.        . carbamazepine (CARBATROL) 200 MG 12 hr capsule Take 200 mg by mouth 2 (two) times daily.        . fenofibrate (TRICOR) 145 MG tablet Take 145 mg by mouth daily.        . furosemide (LASIX) 40 MG tablet Take 40 mg by mouth daily.       Marland Kitchen glipiZIDE (GLUCOTROL) 5 MG 24 hr tablet Take 5 mg by mouth 3 (three) times daily.        . metFORMIN (GLUCOPHAGE) 1000 MG tablet Take 1,000 mg by mouth 2 (two) times daily with a meal.        . nystatin-triamcinolone (MYCOLOG II) cream Apply 1 application topically 2 (two) times daily as needed.        . Omega-3 Fatty Acids (FISH OIL) 1000 MG CAPS Take 1 capsule by mouth 2 (two) times daily.        . promethazine (PHENERGAN) 25 MG tablet Take 25 mg by mouth every 6 (six) hours as needed.        .  pyridostigmine (MESTINON) 180 MG CR tablet Take 180 mg by mouth 2 (two) times daily.       Marland Kitchen rOPINIRole (REQUIP) 1 MG tablet Take 1 mg by mouth daily.        . sotalol (BETAPACE) 120 MG tablet Take 120 mg by mouth 2 (two) times daily.        Marland Kitchen warfarin (COUMADIN) 5 MG tablet Take coumadin 1 1/2 tablets daily except 1 tablet on Mondays and Thursdays  45 tablet  3  . zolpidem (AMBIEN) 10 MG tablet Take 10 mg by mouth at bedtime as needed.          Past Medical History  Diagnosis Date  . Paroxysmal atrial fibrillation   . Type 2 diabetes mellitus   . Seizure disorder   . Myasthenia gravis   . Restless legs syndrome (RLS)   . Sick sinus syndrome     St. Jude Identity dual chamber pacemaker (DDDR) 4/03  . Coronary atherosclerosis of native coronary artery     Nonobstructive, catheterization [facility] 2006  . Mitral regurgitation     Mild  . Pneumonia   . Pneumothorax   . Recurrent UTI     Past Surgical History  Procedure Date  . Appendectomy   .  Cholecystectomy   . Bladder cancer resection   . St. jude identity dual chamber pacemaker (dddr) 4/03     04/03 by Dr Hyacinth Meeker in [place]  . Revision urostomy cutaneous     History   Social History  . Marital Status: Widowed    Spouse Name: N/A    Number of Children: N/A  . Years of Education: N/A   Occupational History  . Retired    Social History Main Topics  . Smoking status: Former Smoker -- 1.5 packs/day for 22 years    Types: Cigarettes    Quit date: 05/17/1970  . Smokeless tobacco: Never Used   Comment:  Pack years: 1-1&1/2 PPD 22years  . Alcohol Use: Yes     Rare occassions  . Drug Use: No  . Sexually Active: Not on file   Other Topics Concern  . Not on file   Social History Narrative  . No narrative on file    Family History  Problem Relation Age of Onset  . Coronary artery disease Brother     ROS: Negative for exertional chest pain, orthopnea, PND, lower extremity edemaAt, presyncope/syncope,  claudication, reflux, hematuria, hematochezia, or melena. Remaining systems reviewed, and are negative.   PHYSICAL EXAM:  BP 146/79  Pulse 79  Ht 5\' 11"  (1.803 m)  Wt 241 lb 1.9 oz (109.371 kg)  BMI 33.63 kg/m2  GENERAL: well-nourished, well-developed; NAD HEENT: NCAT, PERRLA, EOMI; sclera clear; no xanthelasma NECK: palpable bilateral carotid pulses, no bruits; no JVD; no TM LUNGS: CTA bilaterally CARDIAC: RRR (S1, S2); no significant murmurs; no rubs or gallops ABDOMEN: soft, non-tender; intact BS EXTREMETIES: Palpable Right femoral pulse, no hematoma/ecchymosis or bruit; 1+ and peripheral edema SKIN: warm/dry; no obvious rash/lesions MUSCULOSKELETAL: no joint deformity NEURO: no focal deficit; NL affect   EKG:    ASSESSMENT & PLAN:

## 2010-11-26 NOTE — Assessment & Plan Note (Signed)
The patient has since been placed on Norvasc in the interim, apparently for better BP control. Will discontinue, and increase Cardizem to 360 mg daily, instead.

## 2010-12-02 ENCOUNTER — Other Ambulatory Visit (INDEPENDENT_AMBULATORY_CARE_PROVIDER_SITE_OTHER): Payer: Medicare Other | Admitting: *Deleted

## 2010-12-02 DIAGNOSIS — I495 Sick sinus syndrome: Secondary | ICD-10-CM

## 2010-12-02 DIAGNOSIS — I4891 Unspecified atrial fibrillation: Secondary | ICD-10-CM

## 2010-12-02 DIAGNOSIS — I2510 Atherosclerotic heart disease of native coronary artery without angina pectoris: Secondary | ICD-10-CM

## 2010-12-02 DIAGNOSIS — I10 Essential (primary) hypertension: Secondary | ICD-10-CM

## 2010-12-08 ENCOUNTER — Ambulatory Visit (INDEPENDENT_AMBULATORY_CARE_PROVIDER_SITE_OTHER): Payer: Medicare Other | Admitting: *Deleted

## 2010-12-08 DIAGNOSIS — Z7901 Long term (current) use of anticoagulants: Secondary | ICD-10-CM

## 2010-12-08 DIAGNOSIS — I4891 Unspecified atrial fibrillation: Secondary | ICD-10-CM

## 2010-12-08 LAB — POCT INR: INR: 2.7

## 2010-12-10 ENCOUNTER — Encounter: Payer: Medicare Other | Admitting: Internal Medicine

## 2010-12-22 ENCOUNTER — Ambulatory Visit (INDEPENDENT_AMBULATORY_CARE_PROVIDER_SITE_OTHER): Payer: Medicare Other | Admitting: *Deleted

## 2010-12-22 DIAGNOSIS — I4891 Unspecified atrial fibrillation: Secondary | ICD-10-CM

## 2010-12-22 DIAGNOSIS — Z7901 Long term (current) use of anticoagulants: Secondary | ICD-10-CM

## 2010-12-28 ENCOUNTER — Encounter: Payer: Self-pay | Admitting: Internal Medicine

## 2010-12-28 ENCOUNTER — Ambulatory Visit (INDEPENDENT_AMBULATORY_CARE_PROVIDER_SITE_OTHER): Payer: Medicare Other | Admitting: Internal Medicine

## 2010-12-28 DIAGNOSIS — I4891 Unspecified atrial fibrillation: Secondary | ICD-10-CM

## 2010-12-28 DIAGNOSIS — I495 Sick sinus syndrome: Secondary | ICD-10-CM

## 2010-12-28 DIAGNOSIS — Z950 Presence of cardiac pacemaker: Secondary | ICD-10-CM

## 2010-12-28 DIAGNOSIS — I10 Essential (primary) hypertension: Secondary | ICD-10-CM

## 2010-12-28 NOTE — Progress Notes (Signed)
The patient presents today for routine electrophysiology followup after his recent pulse generator change 10/15/10.  Since that time, the patient reports doing very well. He denies complications related to the procedure.  His pacemaker pocket is well healed. Today, he denies symptoms of palpitations, chest pain, shortness of breath, orthopnea, PND, lower extremity edema, dizziness, presyncope, syncope, or neurologic sequela.  He reports no further symptoms of afib since recently seeing Dr Diona Browner. He has had mild subjective fever this am with nausea but denies any other concerns. The patient feels that he is tolerating medications without difficulties and is otherwise without complaint today.   Past Medical History  Diagnosis Date  . Paroxysmal atrial fibrillation   . Type 2 diabetes mellitus   . Seizure disorder   . Myasthenia gravis   . Restless legs syndrome (RLS)   . Sick sinus syndrome     St. Jude Identity dual chamber pacemaker (DDDR) 4/03  . Coronary atherosclerosis of native coronary artery     Nonobstructive, catheterization [facility] 2006  . Mitral regurgitation     Mild  . Pneumonia   . Pneumothorax   . Recurrent UTI    Past Surgical History  Procedure Date  . Appendectomy   . Cholecystectomy   . Bladder cancer resection   . St. jude identity dual chamber pacemaker (dddr) 4/03     04/03 by Dr Hyacinth Meeker in [place], generator change by Fawn Kirk 10/15/10  . Revision urostomy cutaneous     Current Outpatient Prescriptions  Medication Sig Dispense Refill  . b complex vitamins tablet Take 1 tablet by mouth 2 (two) times daily.        . carbamazepine (CARBATROL) 200 MG 12 hr capsule Take 200 mg by mouth 2 (two) times daily.        Marland Kitchen diltiazem (CARDIZEM CD) 360 MG 24 hr capsule Take 1 capsule (360 mg total) by mouth daily.  30 capsule  6  . fenofibrate (TRICOR) 145 MG tablet Take 145 mg by mouth daily.        . furosemide (LASIX) 40 MG tablet Take 40 mg by mouth daily.       Marland Kitchen  glipiZIDE (GLUCOTROL) 5 MG 24 hr tablet Take 5 mg by mouth 3 (three) times daily.        . insulin glargine (LANTUS) 100 UNIT/ML injection Inject 10 Units into the skin every morning.        . metFORMIN (GLUCOPHAGE) 1000 MG tablet Take 1,000 mg by mouth 2 (two) times daily with a meal.        . nystatin-triamcinolone (MYCOLOG II) cream Apply 1 application topically 2 (two) times daily as needed.        . Omega-3 Fatty Acids (FISH OIL) 1000 MG CAPS Take 1 capsule by mouth 2 (two) times daily.        Marland Kitchen omeprazole (PRILOSEC) 20 MG capsule Take 20 mg by mouth 2 (two) times daily.        . promethazine (PHENERGAN) 25 MG tablet Take 25 mg by mouth every 6 (six) hours as needed.        . pyridostigmine (MESTINON) 180 MG CR tablet Take 180 mg by mouth 2 (two) times daily.       Marland Kitchen rOPINIRole (REQUIP) 1 MG tablet Take 1 mg by mouth daily.        . sotalol (BETAPACE) 120 MG tablet Take 120 mg by mouth 2 (two) times daily.        Marland Kitchen warfarin (  COUMADIN) 5 MG tablet Take coumadin 1 1/2 tablets daily except 1 tablet on Mondays and Thursdays  45 tablet  3  . zolpidem (AMBIEN) 10 MG tablet Take 10 mg by mouth at bedtime as needed.          Allergies  Allergen Reactions  . Flexeril (Cyclobenzaprine Hcl)     Can not take any muscle relaxers due to having myasthenia gravis per patient.  . Morphine     REACTION: rash    History   Social History  . Marital Status: Widowed    Spouse Name: N/A    Number of Children: N/A  . Years of Education: N/A   Occupational History  . Retired    Social History Main Topics  . Smoking status: Former Smoker -- 1.5 packs/day for 22 years    Types: Cigarettes    Quit date: 05/17/1970  . Smokeless tobacco: Never Used   Comment:  Pack years: 1-1&1/2 PPD 22years  . Alcohol Use: Yes     Rare occassions  . Drug Use: No  . Sexually Active: Not on file   Other Topics Concern  . Not on file   Social History Narrative  . No narrative on file    Family History    Problem Relation Age of Onset  . Coronary artery disease Brother    Physical Exam: Filed Vitals:   12/28/10 1546  BP: 163/77  Pulse: 97  Resp: 16  Height: 5\' 11"  (1.803 m)  Weight: 234 lb (106.142 kg)    GEN- The patient is well appearing, alert and oriented x 3 today.   Head- normocephalic, atraumatic Eyes-  Sclera clear, conjunctiva pink Ears- hearing intact Oropharynx- clear Neck- supple, no JVP Lymph- no cervical lymphadenopathy Lungs- Clear to ausculation bilaterally, normal work of breathing Chest- pacemaker pocket is well healed Heart- Regular rate and rhythm, no murmurs, rubs or gallops, PMI not laterally displaced GI- soft, NT, ND, + BS Extremities- no clubbing, cyanosis, or edema  Pacemaker interrogation- reviewed in detail today,  See PACEART report  Assessment and Plan:

## 2010-12-28 NOTE — Assessment & Plan Note (Signed)
Continue coumadin longterm His pacemaker reveals afib 19% of the time, though the patient is mostly asymptomatic at this time.  Consider tikosyn if his afib symptoms worsen. No changes today.

## 2010-12-28 NOTE — Assessment & Plan Note (Signed)
Normal pacemaker function See Pace Art report No changes today  

## 2010-12-28 NOTE — Assessment & Plan Note (Signed)
Stable No change required today  

## 2011-01-12 ENCOUNTER — Ambulatory Visit (INDEPENDENT_AMBULATORY_CARE_PROVIDER_SITE_OTHER): Payer: Medicare Other | Admitting: *Deleted

## 2011-01-12 DIAGNOSIS — I4891 Unspecified atrial fibrillation: Secondary | ICD-10-CM

## 2011-01-12 DIAGNOSIS — Z7901 Long term (current) use of anticoagulants: Secondary | ICD-10-CM

## 2011-01-12 MED ORDER — WARFARIN SODIUM 5 MG PO TABS: ORAL_TABLET | ORAL | Status: DC

## 2011-01-21 ENCOUNTER — Encounter: Payer: Medicare Other | Admitting: Internal Medicine

## 2011-02-09 ENCOUNTER — Ambulatory Visit (INDEPENDENT_AMBULATORY_CARE_PROVIDER_SITE_OTHER): Payer: Medicare Other | Admitting: *Deleted

## 2011-02-09 DIAGNOSIS — Z7901 Long term (current) use of anticoagulants: Secondary | ICD-10-CM

## 2011-02-09 DIAGNOSIS — I4891 Unspecified atrial fibrillation: Secondary | ICD-10-CM

## 2011-02-19 ENCOUNTER — Encounter: Payer: Medicare Other | Admitting: *Deleted

## 2011-03-02 ENCOUNTER — Encounter: Payer: Medicare Other | Admitting: *Deleted

## 2011-03-26 ENCOUNTER — Encounter: Payer: Self-pay | Admitting: Cardiology

## 2011-03-26 ENCOUNTER — Ambulatory Visit (INDEPENDENT_AMBULATORY_CARE_PROVIDER_SITE_OTHER): Payer: Medicare Other | Admitting: Cardiology

## 2011-03-26 VITALS — BP 103/72 | HR 70 | Ht 71.0 in | Wt 236.0 lb

## 2011-03-26 DIAGNOSIS — I2510 Atherosclerotic heart disease of native coronary artery without angina pectoris: Secondary | ICD-10-CM

## 2011-03-26 DIAGNOSIS — Z950 Presence of cardiac pacemaker: Secondary | ICD-10-CM

## 2011-03-26 DIAGNOSIS — I4891 Unspecified atrial fibrillation: Secondary | ICD-10-CM

## 2011-03-26 DIAGNOSIS — Z01818 Encounter for other preprocedural examination: Secondary | ICD-10-CM

## 2011-03-26 NOTE — Assessment & Plan Note (Signed)
Patient should be able to proceed with elective endoscopy under anesthesia at an acceptable perioperative cardiovascular risk, overall low to moderate range. Recent cardiac catheterization in June showed nonobstructive CAD, LVEF is 45-50%, and he has otherwise been clinically stable in terms of no progressive angina, no CHF symptoms, and no prolonged or rapid atrial fibrillation. He should remain on his basic cardiac medical regimen. He does have a St. Jude pacemaker in place, and this will therefore need to be interrogated if any Bovie device is employed or other electrical interference anticipated.

## 2011-03-26 NOTE — Assessment & Plan Note (Signed)
St. Jude device in place, followed by Dr. Johney Frame.

## 2011-03-26 NOTE — Assessment & Plan Note (Signed)
Nonobstructive at cardiac catheterization in June of this year, LVEF 45-50%.

## 2011-03-26 NOTE — Assessment & Plan Note (Signed)
Paroxysmal, on Cardizem and sotalol. Patient self-discontinued Coumadin. We discussed stroke risk without anticoagulation. Other alternatives could be considered, such as Pradaxa. We can continue to discuss this, and I asked him to consider the matter carefully.

## 2011-03-26 NOTE — Patient Instructions (Signed)
   Patient to call office back with [facility] information Follow up in  2 months - see above.

## 2011-03-26 NOTE — Progress Notes (Signed)
Clinical Summary [name] is a 70 y.o.male presenting for followup. He was seen by Dr. Johney [name] back in August.  He is being considered for elective upper endoscopy with anesthesia, per Dr. Dyann [name] at [facility]. Preoperative assessment was requested.  Since his last visit, he has self-discontinued Coumadin, stating that it made him "sick." He states that he has had difficulty tolerating Coumadin related to nausea and fatigue, has also had fluctuating INR levels. We discussed his increased risk of stroke with paroxysmal atrial fibrillation off Coumadin, and I asked him to consider other alternatives such as Pradaxa.  He denies any active angina symptoms or progressive shortness of breath. No major progression and palpitations. Medical regimen for PAF in stable. Importantly, cardiac catheterization in June demonstrated only nonobstructive CAD. LVEF was in the range of 45-50% by echocardiogram in July.  His main complaints center on difficulty swallowing liquids and solids.   Allergies  Allergen Reactions  . Flexeril (Cyclobenzaprine Hcl)     Can not take any muscle relaxers due to having myasthenia gravis per patient.  . Morphine     REACTION: rash    Medication list reviewed.  Past Medical History  Diagnosis Date  . Paroxysmal atrial fibrillation   . Type 2 diabetes mellitus   . Seizure disorder   . Myasthenia gravis   . Restless legs syndrome (RLS)   . Sick sinus syndrome     St. Jude Identity dual chamber pacemaker (DDDR) 4/03  . Coronary atherosclerosis of native coronary artery     Nonobstructive, catheterization [facility] 2006  . Mitral regurgitation     Mild  . Pneumonia   . Pneumothorax   . Recurrent UTI     Past Surgical History  Procedure Date  . Appendectomy   . Cholecystectomy   . Bladder cancer resection   . St. jude identity dual chamber pacemaker (dddr) 4/03     04/03 by Dr [name] in [name], generator change by [place] [name] 10/15/10  . Revision urostomy  cutaneous     Family History  Problem Relation Age of Onset  . Coronary artery disease Brother     Social History [name] reports that he quit smoking about 40 years ago. His smoking use included Cigarettes. He has a 33 pack-year smoking history. He has never used smokeless tobacco. [name] reports that he drinks alcohol.  Review of Systems As outlined above, otherwise negative.  Physical Examination Filed Vitals:   03/26/11 0828  BP: 103/72  Pulse: 70   Obese male in no acute distress.  HEENT: Conjunctiva and lids grossly normal with dysconjugate gaze, oropharynx with poor dentition.  Neck: Supple, no elevated JVP or carotid bruits.  Lungs: Clear but diminished breath sounds, nonlabored.  Cardiac: RRR, no S3 or significant murmur.  Thorax: Well-healed device pocket site left.  Abdomen: Soft, protuberant, nontender, bowel sounds present.  Skin: Warm and dry.  Musculoskeletal: No kyphosis.  Extremities: 1+ LLE, non-pitting edema, distal pulses full.  Neuropsychiatric: Alert and oriented x3, affect grossly appropriate.   ECG Reviewed in EMR, ventricular paced rhythm.    Problem List and Plan

## 2011-03-30 ENCOUNTER — Ambulatory Visit: Payer: Self-pay | Admitting: *Deleted

## 2011-03-30 DIAGNOSIS — Z7901 Long term (current) use of anticoagulants: Secondary | ICD-10-CM

## 2011-03-30 DIAGNOSIS — I4891 Unspecified atrial fibrillation: Secondary | ICD-10-CM

## 2011-05-19 ENCOUNTER — Encounter: Payer: Self-pay | Admitting: Cardiology

## 2011-05-19 ENCOUNTER — Ambulatory Visit (INDEPENDENT_AMBULATORY_CARE_PROVIDER_SITE_OTHER): Payer: Medicare Other | Admitting: Cardiology

## 2011-05-19 VITALS — BP 173/98 | HR 69 | Ht 71.0 in | Wt 230.0 lb

## 2011-05-19 DIAGNOSIS — I10 Essential (primary) hypertension: Secondary | ICD-10-CM

## 2011-05-19 DIAGNOSIS — I4891 Unspecified atrial fibrillation: Secondary | ICD-10-CM

## 2011-05-19 DIAGNOSIS — Z950 Presence of cardiac pacemaker: Secondary | ICD-10-CM

## 2011-05-19 DIAGNOSIS — I2510 Atherosclerotic heart disease of native coronary artery without angina pectoris: Secondary | ICD-10-CM

## 2011-05-19 NOTE — Assessment & Plan Note (Signed)
Maintaining sinus rhythm with no major palpitations. Continue Cardizem CD and sotalol. He does not want to take any anticoagulant therapies at this time, having stopped Coumadin on his own. We have discussed the risk of stroke.

## 2011-05-19 NOTE — Assessment & Plan Note (Signed)
Continue regular followup with Dr. Allred. 

## 2011-05-19 NOTE — Assessment & Plan Note (Signed)
Blood pressure elevated today, rechecked at 150/85. Discussed diet, sodium restriction, exercise. I also encouraged him to follow up with Dr. Olena Leatherwood.

## 2011-05-19 NOTE — Patient Instructions (Signed)
Your physician wants you to follow-up in: 6 months. You will receive a reminder letter in the mail one-two months in advance. If you don't receive a letter, please call our office to schedule the follow-up appointment. Your physician recommends that you continue on your current medications as directed. Please refer to the Current Medication list given to you today. 

## 2011-05-19 NOTE — Assessment & Plan Note (Signed)
Nonobstructive disease with LVEF 45-50%. Continue observation.

## 2011-05-19 NOTE — Progress Notes (Signed)
Clinical Summary Frederick [name] is a 71 y.o.male presenting for followup. He was seen in November. States that he subsequently underwent endoscopic procedure at [facility] with improved symptoms in general.  Still denies any major angina symptoms, no progressive shortness of breath, no significant palpitations. He is in sinus rhythm today on combination of sotalol and Cardizem CD.  We discussed the issue of anticoagulation again, and he continues to prefer no anticoagulant therapies. We have discussed the risk of stroke.  Blood pressure elevated today. Patient states that he has been compliant with medications. We discussed his diet, sodium restriction and exercise. Also follow up with Dr. Olena Leatherwood.   Allergies  Allergen Reactions  . Flexeril (Cyclobenzaprine Hcl)     Can not take any muscle relaxers due to having myasthenia gravis per patient.  . Morphine     REACTION: rash    Medication list reviewed.  Past Medical History  Diagnosis Date  . Paroxysmal atrial fibrillation   . Type 2 diabetes mellitus   . Seizure disorder   . Myasthenia gravis   . Restless legs syndrome (RLS)   . Sick sinus syndrome     St. Jude Identity dual chamber pacemaker (DDDR) 4/03  . Coronary atherosclerosis of native coronary artery     Nonobstructive, catheterization [facility] 2006  . Mitral regurgitation     Mild  . Pneumonia   . Pneumothorax   . Recurrent UTI     Past Surgical History  Procedure Date  . Appendectomy   . Cholecystectomy   . Bladder cancer resection   . St. jude identity dual chamber pacemaker (dddr) 4/03     04/03 by Dr Hyacinth Meeker in [place], generator change by Fawn Kirk 10/15/10  . Revision urostomy cutaneous     Family History  Problem Relation Age of Onset  . Coronary artery disease Brother     Social History Frederick [name] reports that he quit smoking about 41 years ago. His smoking use included Cigarettes. He has a 33 pack-year smoking history. He has never used smokeless  tobacco. Frederick [name] reports that he drinks alcohol.  Review of Systems No significant angina or palpitations. Reports better energy. No orthopnea or PND. No reported bleeding problems. Otherwise negative.  Physical Examination Filed Vitals:   05/19/11 1359  BP: 173/98  Pulse: 69   Obese male in no acute distress.  HEENT: Conjunctiva and lids grossly normal with dysconjugate gaze, oropharynx with poor dentition.  Neck: Supple, no elevated JVP or carotid bruits.  Lungs: Clear but diminished breath sounds, nonlabored.  Cardiac: RRR, no S3 or significant murmur.  Abdomen: Soft, protuberant, nontender, bowel sounds present.  Skin: Warm and dry.  Musculoskeletal: No kyphosis.  Extremities: 1+ LLE, non-pitting edema, distal pulses full.  Neuropsychiatric: Alert and oriented x3, affect grossly appropriate.   ECG Reviewed in EMR.    Problem List and Plan

## 2011-07-19 ENCOUNTER — Other Ambulatory Visit: Payer: Self-pay | Admitting: Physician Assistant

## 2011-09-24 ENCOUNTER — Other Ambulatory Visit: Payer: Self-pay

## 2011-10-06 ENCOUNTER — Ambulatory Visit (INDEPENDENT_AMBULATORY_CARE_PROVIDER_SITE_OTHER): Payer: Medicare Other | Admitting: Internal Medicine

## 2011-10-06 ENCOUNTER — Encounter: Payer: Self-pay | Admitting: Internal Medicine

## 2011-10-06 VITALS — BP 103/54 | HR 60 | Resp 16 | Ht 71.0 in | Wt 226.0 lb

## 2011-10-06 DIAGNOSIS — I495 Sick sinus syndrome: Secondary | ICD-10-CM

## 2011-10-06 DIAGNOSIS — I4891 Unspecified atrial fibrillation: Secondary | ICD-10-CM

## 2011-10-06 LAB — PACEMAKER DEVICE OBSERVATION
BAMS-0001: 150 {beats}/min
BAMS-0003: 70 {beats}/min
BATTERY VOLTAGE: 2.9629 V
RV LEAD AMPLITUDE: 5.7 mv
VENTRICULAR PACING PM: 8.5

## 2011-10-06 NOTE — Assessment & Plan Note (Signed)
Paroxysmal afib He has stopped coumadin The importance of anticoagulation with either coumadin, pradaxa, xarelto, or eliquis were discussed at length with the patient.  He is clear in his decision to decline anticoagulation today.  He is informed of his increased risks of stroke off of anticoagulation.  He will discuss further with Dr Diona Browner upon return.

## 2011-10-06 NOTE — Progress Notes (Signed)
PCP: Toma Deiters, MD, MD Primary Cardiologist:  Dr Johny Drilling Frederick [name] is a 71 y.o. male who presents today for routine electrophysiology followup.  Since last being seen in our clinic, the patient reports doing very well.  Today, he denies symptoms of palpitations, chest pain, shortness of breath,  lower extremity edema, dizziness, presyncope, or syncope.  He has stopped coumadin.  The patient is otherwise without complaint today.   Past Medical History  Diagnosis Date  . Paroxysmal atrial fibrillation   . Type 2 diabetes mellitus   . Seizure disorder   . Myasthenia gravis   . Restless legs syndrome (RLS)   . Sick sinus syndrome     St. Jude Identity dual chamber pacemaker (DDDR) 4/03  . Coronary atherosclerosis of native coronary artery     Nonobstructive, catheterization [facility] 2006  . Mitral regurgitation     Mild  . Pneumonia   . Pneumothorax   . Recurrent UTI    Past Surgical History  Procedure Date  . Appendectomy   . Cholecystectomy   . Bladder cancer resection   . St. jude identity dual chamber pacemaker (dddr) 4/03     04/03 by Dr Hyacinth Meeker in [place], generator change by Fawn Kirk 10/15/10  . Revision urostomy cutaneous     Current Outpatient Prescriptions  Medication Sig Dispense Refill  . carbamazepine (CARBATROL) 200 MG 12 hr capsule Take 200 mg by mouth 2 (two) times daily.        Marland Kitchen diltiazem (TIAZAC) 360 MG 24 hr capsule TAKE (1) CAPSULE ONCE DAILY  30 capsule  6  . furosemide (LASIX) 40 MG tablet Take 40 mg by mouth daily.       Marland Kitchen glipiZIDE (GLUCOTROL) 5 MG 24 hr tablet Take 5 mg by mouth 3 (three) times daily.        . insulin aspart (NOVOLOG) 100 UNIT/ML injection Inject into the skin as directed.        . insulin glargine (LANTUS) 100 UNIT/ML injection Inject 20 Units into the skin every morning.       . nystatin-triamcinolone (MYCOLOG II) cream Apply 1 application topically 2 (two) times daily as needed.        Marland Kitchen omeprazole (PRILOSEC) 20 MG capsule  Take 20 mg by mouth 2 (two) times daily.        . Pancrelipase, Lip-Prot-Amyl, (CREON) 24000 UNITS CPEP Take 2 capsules by mouth 3 (three) times daily before meals.        . promethazine (PHENERGAN) 25 MG tablet Take 25 mg by mouth every 6 (six) hours as needed.        . pyridostigmine (MESTINON) 180 MG CR tablet Take 180 mg by mouth 2 (two) times daily.       Marland Kitchen rOPINIRole (REQUIP) 1 MG tablet Take 1 mg by mouth daily.        . sotalol (BETAPACE) 120 MG tablet Take 120 mg by mouth 2 (two) times daily.        Marland Kitchen zolpidem (AMBIEN) 10 MG tablet Take 10 mg by mouth at bedtime as needed.          Physical Exam: Filed Vitals:   10/06/11 1046  BP: 103/54  Pulse: 60  Resp: 16  Height: 5\' 11"  (1.803 m)  Weight: 226 lb (102.513 kg)    GEN- The patient is well appearing, alert and oriented x 3 today.   Head- normocephalic, atraumatic Eyes-  Sclera clear, conjunctiva pink Ears- hearing intact Oropharynx- clear Lungs-  Clear to ausculation bilaterally, normal work of breathing Chest- pacemaker pocket is well healed Heart- Regular rate and rhythm, no murmurs, rubs or gallops, PMI not laterally displaced GI- soft, NT, ND, + BS Extremities- no clubbing, cyanosis, or edema  Pacemaker interrogation- reviewed in detail today,  See PACEART report  Assessment and Plan:

## 2011-10-06 NOTE — Patient Instructions (Signed)
Device check 01/13/12  Follow up with Allred in 1 year 

## 2011-10-06 NOTE — Assessment & Plan Note (Signed)
Normal pacemaker function See Pace Art report No changes today  

## 2011-12-10 ENCOUNTER — Ambulatory Visit: Payer: Medicare Other | Admitting: Cardiology

## 2012-01-13 ENCOUNTER — Encounter: Payer: Medicare Other | Admitting: *Deleted

## 2012-01-13 DIAGNOSIS — R55 Syncope and collapse: Secondary | ICD-10-CM

## 2012-01-18 ENCOUNTER — Encounter: Payer: Self-pay | Admitting: *Deleted

## 2012-05-25 ENCOUNTER — Encounter: Payer: Self-pay | Admitting: *Deleted

## 2012-06-22 DIAGNOSIS — R079 Chest pain, unspecified: Secondary | ICD-10-CM

## 2012-07-05 ENCOUNTER — Encounter: Payer: Self-pay | Admitting: Internal Medicine

## 2012-07-05 ENCOUNTER — Ambulatory Visit (INDEPENDENT_AMBULATORY_CARE_PROVIDER_SITE_OTHER): Payer: Medicare Other | Admitting: Internal Medicine

## 2012-07-05 VITALS — BP 112/66 | HR 74 | Ht 71.0 in | Wt 217.0 lb

## 2012-07-05 DIAGNOSIS — I4891 Unspecified atrial fibrillation: Secondary | ICD-10-CM

## 2012-07-05 DIAGNOSIS — I10 Essential (primary) hypertension: Secondary | ICD-10-CM

## 2012-07-05 DIAGNOSIS — I495 Sick sinus syndrome: Secondary | ICD-10-CM

## 2012-07-05 LAB — PACEMAKER DEVICE OBSERVATION
AL AMPLITUDE: 0.9 mv
ATRIAL PACING PM: 1
BAMS-0001: 150 {beats}/min
BAMS-0003: 70 {beats}/min
BATTERY VOLTAGE: 2.9478 V
BRDY-0002RV: 60 {beats}/min
BRDY-0003RV: 110 {beats}/min
DEVICE MODEL PM: 7243614
RV LEAD AMPLITUDE: 7.2 mv
RV LEAD THRESHOLD: 0.75 V

## 2012-07-05 MED ORDER — RIVAROXABAN 20 MG PO TABS: 20.0000 mg | ORAL_TABLET | Freq: Every day | ORAL | Status: DC

## 2012-07-05 NOTE — Patient Instructions (Signed)
   Stop Sotalol  Begin Xarelto 20mg  every evening with a meal  Continue all other current medications. Keep already scheduled appointment with Dr. Diona Browner in April Allred - 1 year

## 2012-07-05 NOTE — Assessment & Plan Note (Signed)
Normal pacemaker function See Pace Art report No changes today  A wireless adapter will be sent to his home for [facility] device follow-up

## 2012-07-05 NOTE — Assessment & Plan Note (Signed)
He has been in persistent afib since June and asymptomatic.  Will stop sotalol at this point and plan rate control going forward.  His CHADS2 score is at least 2 (HTN, DM) and he is 72.  He should therefore be anticoagulated long term. Today, I discussed novel anticoagulants including pradaxa, xarelto, and eliquis today as indicated for risk reduction in stroke and systemic emboli with nonvalvular atrial fibrillation.  He has previously failed coumadin due to symptoms of fatigue and feeling "tired".  Risks, benefits, and alternatives to each of these drugs were discussed at length today.  He would prefer xarelto. CrCL is 66.  I will therefore start xarelto 20mg  daily today.  He will follow-up with Dr Diona Browner in 2 months.  He may benefit from CBC and BMET at that time.

## 2012-07-05 NOTE — Progress Notes (Signed)
PCP: Toma Deiters, MD Primary Cardiologist:  Dr Johny Drilling [place] [name] is a 72 y.o. male who presents today for routine electrophysiology followup.  Since last being seen in our clinic, the patient reports doing reasonably well.  He was recently admitted to [facility] and seen by Dr Diona Browner for atypical chest pain.  Medical therapy was advised.  He has had no further pain. Today, he denies symptoms of palpitations,shortness of breath,  lower extremity edema, dizziness, presyncope, or syncope.  He has stopped coumadin.  The patient is otherwise without complaint today.   Past Medical History  Diagnosis Date  . Persistent atrial fibrillation   . Type 2 diabetes mellitus   . Seizure disorder   . Myasthenia gravis   . Restless legs syndrome (RLS)   . Sick sinus syndrome     St. Jude Identity dual chamber pacemaker (DDDR) 4/03  . Coronary atherosclerosis of native coronary artery     Nonobstructive, catheterization [facility] 2006  . Mitral regurgitation     Mild  . Pneumonia   . Pneumothorax   . Recurrent UTI    Past Surgical History  Procedure Laterality Date  . Appendectomy    . Cholecystectomy    . Bladder cancer resection    . St. jude identity dual chamber pacemaker (dddr) 4/03      04/03 by Dr Hyacinth Meeker in [place], generator change by Fawn Kirk 10/15/10  . Revision urostomy cutaneous      Current Outpatient Prescriptions  Medication Sig Dispense Refill  . azithromycin (ZITHROMAX) 250 MG tablet Take 250 mg by mouth daily. x3days      . benazepril (LOTENSIN) 10 MG tablet Take 10 mg by mouth daily.       . carbamazepine (CARBATROL) 200 MG 12 hr capsule Take 200 mg by mouth 2 (two) times daily.        Marland Kitchen diltiazem (TIAZAC) 360 MG 24 hr capsule TAKE (1) CAPSULE ONCE DAILY  30 capsule  6  . furosemide (LASIX) 40 MG tablet Take 40 mg by mouth daily.       Marland Kitchen glipiZIDE (GLUCOTROL) 5 MG 24 hr tablet Take 5 mg by mouth 3 (three) times daily.        . insulin aspart (NOVOLOG) 100 UNIT/ML  injection Inject into the skin as directed.        . insulin glargine (LANTUS) 100 UNIT/ML injection Inject 20 Units into the skin every morning.       . nystatin-triamcinolone (MYCOLOG II) cream Apply 1 application topically 2 (two) times daily as needed.        Marland Kitchen omeprazole (PRILOSEC) 20 MG capsule Take 20 mg by mouth 2 (two) times daily.        . Pancrelipase, Lip-Prot-Amyl, (CREON) 24000 UNITS CPEP Take 2 capsules by mouth 3 (three) times daily before meals.        . promethazine (PHENERGAN) 25 MG tablet Take 25 mg by mouth every 6 (six) hours as needed.        . pyridostigmine (MESTINON) 180 MG CR tablet Take 180 mg by mouth 2 (two) times daily.       Marland Kitchen rOPINIRole (REQUIP) 1 MG tablet Take 1 mg by mouth daily.        Marland Kitchen zolpidem (AMBIEN) 10 MG tablet Take 10 mg by mouth at bedtime as needed.         No current facility-administered medications for this visit.    Physical Exam: Filed Vitals:   07/05/12 0840  BP: 112/66  Pulse: 74  Height: 5\' 11"  (1.803 m)  Weight: 217 lb (98.431 kg)    GEN- The patient is well appearing, alert and oriented x 3 today.   Head- normocephalic, atraumatic Eyes-  Sclera clear, conjunctiva pink Ears- hearing intact Oropharynx- clear Lungs- Clear to ausculation bilaterally, normal work of breathing Chest- pacemaker pocket is well healed Heart- Regular rate and rhythm, no murmurs, rubs or gallops, PMI not laterally displaced GI- soft, NT, ND, + BS Extremities- no clubbing, cyanosis, or edema  Pacemaker interrogation- reviewed in detail today,  See PACEART report  Assessment and Plan:

## 2012-07-05 NOTE — Assessment & Plan Note (Signed)
Stable No change required today  

## 2012-08-28 ENCOUNTER — Encounter: Payer: Self-pay | Admitting: Cardiology

## 2012-08-28 ENCOUNTER — Ambulatory Visit (INDEPENDENT_AMBULATORY_CARE_PROVIDER_SITE_OTHER): Payer: Medicare Other | Admitting: Cardiology

## 2012-08-28 VITALS — BP 144/74 | HR 80 | Ht 71.0 in | Wt 225.0 lb

## 2012-08-28 DIAGNOSIS — I2510 Atherosclerotic heart disease of native coronary artery without angina pectoris: Secondary | ICD-10-CM

## 2012-08-28 DIAGNOSIS — I4891 Unspecified atrial fibrillation: Secondary | ICD-10-CM

## 2012-08-28 DIAGNOSIS — Z950 Presence of cardiac pacemaker: Secondary | ICD-10-CM

## 2012-08-28 MED ORDER — RIVAROXABAN 15 MG PO TABS: 15.0000 mg | ORAL_TABLET | Freq: Every day | ORAL | Status: DC

## 2012-08-28 NOTE — Assessment & Plan Note (Signed)
Prior documentation of nonobstructive disease, no active angina at this time.

## 2012-08-28 NOTE — Assessment & Plan Note (Signed)
Persistent, plan to continue strategy of heart rate control and anticoagulation. Will reduce Xarelto to 15 mg daily in light of GFR under 60. Followup lab work pending with Dr. Olena Leatherwood next month. Will review when available. Otherwise keep regular followup.

## 2012-08-28 NOTE — Progress Notes (Signed)
Clinical Summary Frederick [name] is a 72 y.o.male last seen in January 2013. Internal followup with Dr. Johney Frame noted. Sotalol was discontinued by Dr. Johney Frame, and anticoagulation was again discussed. He did agree to start Xarelto 20 mg daily at that time.  Lab work from February reviewed finding hemoglobin 14.5, platelets 184, BUN 25, quinine 1.3, GFR 51, potassium 4.3. Renal dysfunction looks to be chronic based on review.  He reports no major palpitations, no bleeding episodes. States he has been taking his medications as directed. He reports no angina symptoms.   Allergies  Allergen Reactions  . Morphine     REACTION: rash    Current Outpatient Prescriptions  Medication Sig Dispense Refill  . carbamazepine (CARBATROL) 200 MG 12 hr capsule Take 200 mg by mouth 2 (two) times daily.        Marland Kitchen diltiazem (TIAZAC) 360 MG 24 hr capsule TAKE (1) CAPSULE ONCE DAILY  30 capsule  6  . fluticasone (FLONASE) 50 MCG/ACT nasal spray Place 2 sprays into the nose daily.       . furosemide (LASIX) 40 MG tablet Take 40 mg by mouth daily.       Marland Kitchen glipiZIDE (GLUCOTROL) 5 MG 24 hr tablet Take 5 mg by mouth 3 (three) times daily.        . insulin aspart (NOVOLOG) 100 UNIT/ML injection Inject into the skin as directed.        . insulin glargine (LANTUS) 100 UNIT/ML injection Inject 20 Units into the skin every morning.       . nystatin-triamcinolone (MYCOLOG II) cream Apply 1 application topically 2 (two) times daily as needed.        Marland Kitchen omeprazole (PRILOSEC) 20 MG capsule Take 20 mg by mouth 2 (two) times daily.        . Pancrelipase, Lip-Prot-Amyl, (CREON) 24000 UNITS CPEP Take 2 capsules by mouth 3 (three) times daily before meals.        . promethazine (PHENERGAN) 25 MG tablet Take 25 mg by mouth every 6 (six) hours as needed.        . pyridostigmine (MESTINON) 180 MG CR tablet Take 180 mg by mouth 2 (two) times daily.       Marland Kitchen rOPINIRole (REQUIP) 1 MG tablet Take 1 mg by mouth daily.        Marland Kitchen zolpidem  (AMBIEN) 10 MG tablet Take 10 mg by mouth at bedtime as needed.        . Rivaroxaban (XARELTO) 15 MG TABS tablet Take 1 tablet (15 mg total) by mouth daily.  30 tablet  6   No current facility-administered medications for this visit.    Past Medical History  Diagnosis Date  . Persistent atrial fibrillation   . Type 2 diabetes mellitus   . Seizure disorder   . Myasthenia gravis   . Restless legs syndrome (RLS)   . Sick sinus syndrome     St. Jude Identity dual chamber pacemaker (DDDR) 4/03  . Coronary atherosclerosis of native coronary artery     Nonobstructive, catheterization [facility] 2006  . Mitral regurgitation     Mild  . Pneumonia   . Pneumothorax   . Recurrent UTI     Past Surgical History  Procedure Laterality Date  . Appendectomy    . Cholecystectomy    . Bladder cancer resection    . St. jude identity dual chamber pacemaker (dddr) 4/03      04/03 by Dr Hyacinth Meeker in [place], generator change by  JA 10/15/10  . Revision urostomy cutaneous      Social History Frederick [name] reports that he quit smoking about 42 years ago. His smoking use included Cigarettes. He has a 33 pack-year smoking history. He has never used smokeless tobacco. Frederick [name] reports that  drinks alcohol.  Review of Systems Chronic, intermittent problems with dysphagia. Follows with a specialist at [facility].  Physical Examination Filed Vitals:   08/28/12 1418  BP: 144/74  Pulse: 80   Filed Weights   08/28/12 1418  Weight: 225 lb (102.059 kg)    Obese male in no acute distress.  HEENT: Conjunctiva and lids grossly normal with dysconjugate gaze, oropharynx with poor dentition.  Neck: Supple, no elevated JVP or carotid bruits.  Lungs: Clear but diminished breath sounds, nonlabored.  Cardiac: Irregular, no S3 or significant murmur.  Abdomen: Soft, protuberant, nontender, bowel sounds present.  Skin: Warm and dry.  Musculoskeletal: No kyphosis.  Extremities: 1+ LLE, non-pitting edema,  distal pulses full.  Neuropsychiatric: Alert and oriented x3, affect grossly appropriate.   Problem List and Plan   Atrial fibrillation Persistent, plan to continue strategy of heart rate control and anticoagulation. Will reduce Xarelto to 15 mg daily in light of GFR under 60. Followup lab work pending with Dr. Olena Leatherwood next month. Will review when available. Otherwise keep regular followup.  CARDIAC PACEMAKER IN SITU Keep followup with Dr. Johney Frame, St. Jude device in place.  CORONARY ATHEROSCLEROSIS NATIVE CORONARY ARTERY Prior documentation of nonobstructive disease, no active angina at this time.    Jonelle Sidle, M.D., F.A.C.C.

## 2012-08-28 NOTE — Patient Instructions (Addendum)
Your physician recommends that you schedule a follow-up appointment in: 6 months. You will receive a reminder letter in the mail in about 4 months reminding you to call and schedule your appointment. If you don't receive this letter, please contact our office. Your physician has recommended you make the following change in your medication: Decrease your xarelto dose to 15 mg daily. Your new prescription has been sent to your pharmacy. All other medications will remain the same.

## 2012-08-28 NOTE — Assessment & Plan Note (Signed)
Keep followup with Dr. Johney Frame, St. Jude device in place.

## 2012-10-02 ENCOUNTER — Ambulatory Visit (INDEPENDENT_AMBULATORY_CARE_PROVIDER_SITE_OTHER): Payer: Medicare Other | Admitting: *Deleted

## 2012-10-02 ENCOUNTER — Encounter: Payer: Self-pay | Admitting: Internal Medicine

## 2012-10-02 DIAGNOSIS — Z950 Presence of cardiac pacemaker: Secondary | ICD-10-CM

## 2012-10-02 DIAGNOSIS — R001 Bradycardia, unspecified: Secondary | ICD-10-CM

## 2012-10-02 DIAGNOSIS — I498 Other specified cardiac arrhythmias: Secondary | ICD-10-CM

## 2012-10-02 LAB — REMOTE PACEMAKER DEVICE
BAMS-0001: 150 {beats}/min
BAMS-0003: 70 {beats}/min
BRDY-0002RV: 60 {beats}/min
BRDY-0003RV: 110 {beats}/min
DEVICE MODEL PM: 7243614
RV LEAD AMPLITUDE: 4.5 mv
RV LEAD IMPEDENCE PM: 540 Ohm

## 2012-10-05 ENCOUNTER — Encounter: Payer: Medicare Other | Admitting: Internal Medicine

## 2012-10-20 ENCOUNTER — Encounter: Payer: Self-pay | Admitting: *Deleted

## 2012-11-14 ENCOUNTER — Encounter (HOSPITAL_COMMUNITY)
Admission: EM | Disposition: A | Discharge status: Nursing Home (Includes ICF) | Payer: Self-pay | Source: Home / Self Care | Attending: Neurology

## 2012-11-14 ENCOUNTER — Encounter (HOSPITAL_COMMUNITY): Payer: Self-pay | Admitting: Radiology

## 2012-11-14 ENCOUNTER — Encounter (HOSPITAL_COMMUNITY): Payer: Self-pay | Admitting: Certified Registered Nurse Anesthetist

## 2012-11-14 ENCOUNTER — Emergency Department (HOSPITAL_COMMUNITY): Payer: Medicare Other

## 2012-11-14 ENCOUNTER — Inpatient Hospital Stay (HOSPITAL_COMMUNITY): Payer: Medicare Other

## 2012-11-14 ENCOUNTER — Inpatient Hospital Stay (HOSPITAL_COMMUNITY): Payer: Medicare Other | Admitting: Certified Registered Nurse Anesthetist

## 2012-11-14 ENCOUNTER — Inpatient Hospital Stay (HOSPITAL_COMMUNITY)
Admission: EM | Admit: 2012-11-14 | Discharge: 2012-11-23 | DRG: 003 | Disposition: A | Discharge status: Other Acute Inpatient Hospital | Payer: Medicare Other | Attending: Pulmonary Disease | Admitting: Pulmonary Disease

## 2012-11-14 DIAGNOSIS — I6330 Cerebral infarction due to thrombosis of unspecified cerebral artery: Principal | ICD-10-CM | POA: Diagnosis present

## 2012-11-14 DIAGNOSIS — Z950 Presence of cardiac pacemaker: Secondary | ICD-10-CM

## 2012-11-14 DIAGNOSIS — I4891 Unspecified atrial fibrillation: Secondary | ICD-10-CM | POA: Diagnosis present

## 2012-11-14 DIAGNOSIS — E1142 Type 2 diabetes mellitus with diabetic polyneuropathy: Secondary | ICD-10-CM

## 2012-11-14 DIAGNOSIS — G9340 Encephalopathy, unspecified: Secondary | ICD-10-CM | POA: Diagnosis present

## 2012-11-14 DIAGNOSIS — I495 Sick sinus syndrome: Secondary | ICD-10-CM | POA: Diagnosis present

## 2012-11-14 DIAGNOSIS — Z8249 Family history of ischemic heart disease and other diseases of the circulatory system: Secondary | ICD-10-CM

## 2012-11-14 DIAGNOSIS — Z7982 Long term (current) use of aspirin: Secondary | ICD-10-CM

## 2012-11-14 DIAGNOSIS — G40909 Epilepsy, unspecified, not intractable, without status epilepticus: Secondary | ICD-10-CM | POA: Diagnosis present

## 2012-11-14 DIAGNOSIS — N189 Chronic kidney disease, unspecified: Secondary | ICD-10-CM | POA: Diagnosis present

## 2012-11-14 DIAGNOSIS — I6350 Cerebral infarction due to unspecified occlusion or stenosis of unspecified cerebral artery: Secondary | ICD-10-CM

## 2012-11-14 DIAGNOSIS — E876 Hypokalemia: Secondary | ICD-10-CM | POA: Diagnosis not present

## 2012-11-14 DIAGNOSIS — Z7901 Long term (current) use of anticoagulants: Secondary | ICD-10-CM

## 2012-11-14 DIAGNOSIS — E119 Type 2 diabetes mellitus without complications: Secondary | ICD-10-CM | POA: Diagnosis present

## 2012-11-14 DIAGNOSIS — G2581 Restless legs syndrome: Secondary | ICD-10-CM | POA: Diagnosis present

## 2012-11-14 DIAGNOSIS — Z936 Other artificial openings of urinary tract status: Secondary | ICD-10-CM

## 2012-11-14 DIAGNOSIS — G8190 Hemiplegia, unspecified affecting unspecified side: Secondary | ICD-10-CM | POA: Diagnosis present

## 2012-11-14 DIAGNOSIS — I2510 Atherosclerotic heart disease of native coronary artery without angina pectoris: Secondary | ICD-10-CM | POA: Diagnosis present

## 2012-11-14 DIAGNOSIS — E46 Unspecified protein-calorie malnutrition: Secondary | ICD-10-CM | POA: Diagnosis present

## 2012-11-14 DIAGNOSIS — E1149 Type 2 diabetes mellitus with other diabetic neurological complication: Secondary | ICD-10-CM | POA: Diagnosis present

## 2012-11-14 DIAGNOSIS — Z79899 Other long term (current) drug therapy: Secondary | ICD-10-CM

## 2012-11-14 DIAGNOSIS — I129 Hypertensive chronic kidney disease with stage 1 through stage 4 chronic kidney disease, or unspecified chronic kidney disease: Secondary | ICD-10-CM | POA: Diagnosis present

## 2012-11-14 DIAGNOSIS — R1310 Dysphagia, unspecified: Secondary | ICD-10-CM | POA: Diagnosis present

## 2012-11-14 DIAGNOSIS — R001 Bradycardia, unspecified: Secondary | ICD-10-CM

## 2012-11-14 DIAGNOSIS — J9600 Acute respiratory failure, unspecified whether with hypoxia or hypercapnia: Secondary | ICD-10-CM | POA: Diagnosis present

## 2012-11-14 DIAGNOSIS — N179 Acute kidney failure, unspecified: Secondary | ICD-10-CM | POA: Diagnosis not present

## 2012-11-14 DIAGNOSIS — I639 Cerebral infarction, unspecified: Secondary | ICD-10-CM | POA: Diagnosis present

## 2012-11-14 DIAGNOSIS — I10 Essential (primary) hypertension: Secondary | ICD-10-CM | POA: Diagnosis present

## 2012-11-14 DIAGNOSIS — E785 Hyperlipidemia, unspecified: Secondary | ICD-10-CM | POA: Diagnosis present

## 2012-11-14 DIAGNOSIS — D696 Thrombocytopenia, unspecified: Secondary | ICD-10-CM | POA: Diagnosis not present

## 2012-11-14 DIAGNOSIS — Z930 Tracheostomy status: Secondary | ICD-10-CM

## 2012-11-14 DIAGNOSIS — Z794 Long term (current) use of insulin: Secondary | ICD-10-CM

## 2012-11-14 DIAGNOSIS — D649 Anemia, unspecified: Secondary | ICD-10-CM | POA: Diagnosis not present

## 2012-11-14 DIAGNOSIS — Z87891 Personal history of nicotine dependence: Secondary | ICD-10-CM

## 2012-11-14 DIAGNOSIS — R0902 Hypoxemia: Secondary | ICD-10-CM | POA: Diagnosis present

## 2012-11-14 DIAGNOSIS — R4701 Aphasia: Secondary | ICD-10-CM | POA: Diagnosis present

## 2012-11-14 DIAGNOSIS — Z9089 Acquired absence of other organs: Secondary | ICD-10-CM

## 2012-11-14 DIAGNOSIS — Z8551 Personal history of malignant neoplasm of bladder: Secondary | ICD-10-CM

## 2012-11-14 DIAGNOSIS — G7000 Myasthenia gravis without (acute) exacerbation: Secondary | ICD-10-CM | POA: Diagnosis present

## 2012-11-14 HISTORY — PX: RADIOLOGY WITH ANESTHESIA: SHX6223

## 2012-11-14 LAB — POCT I-STAT 3, ART BLOOD GAS (G3+)
Acid-base deficit: 3 mmol/L — ABNORMAL HIGH (ref 0.0–2.0)
Bicarbonate: 22.5 mEq/L (ref 20.0–24.0)
Patient temperature: 98.7
pH, Arterial: 7.325 — ABNORMAL LOW (ref 7.350–7.450)

## 2012-11-14 LAB — DIFFERENTIAL
Basophils Relative: 1 % (ref 0–1)
Eosinophils Absolute: 0.2 10*3/uL (ref 0.0–0.7)
Neutrophils Relative %: 58 % (ref 43–77)

## 2012-11-14 LAB — POCT I-STAT, CHEM 8
BUN: 14 mg/dL (ref 6–23)
Calcium, Ion: 1.2 mmol/L (ref 1.13–1.30)
Chloride: 106 mEq/L (ref 96–112)

## 2012-11-14 LAB — COMPREHENSIVE METABOLIC PANEL
ALT: 14 U/L (ref 0–53)
Albumin: 3.4 g/dL — ABNORMAL LOW (ref 3.5–5.2)
Alkaline Phosphatase: 89 U/L (ref 39–117)
BUN: 14 mg/dL (ref 6–23)
Potassium: 3.5 mEq/L (ref 3.5–5.1)
Sodium: 139 mEq/L (ref 135–145)
Total Protein: 6.3 g/dL (ref 6.0–8.3)

## 2012-11-14 LAB — CBC
MCH: 30.6 pg (ref 26.0–34.0)
MCHC: 35.5 g/dL (ref 30.0–36.0)
Platelets: 148 10*3/uL — ABNORMAL LOW (ref 150–400)
RDW: 15.7 % — ABNORMAL HIGH (ref 11.5–15.5)

## 2012-11-14 LAB — PROTIME-INR
INR: 1.2 (ref 0.00–1.49)
Prothrombin Time: 14.9 seconds (ref 11.6–15.2)

## 2012-11-14 LAB — POCT I-STAT TROPONIN I: Troponin i, poc: 0.02 ng/mL (ref 0.00–0.08)

## 2012-11-14 LAB — TROPONIN I: Troponin I: <0.3 ng/mL (ref <0.30)

## 2012-11-14 SURGERY — RADIOLOGY WITH ANESTHESIA
Anesthesia: General

## 2012-11-14 MED ORDER — SUCCINYLCHOLINE CHLORIDE 20 MG/ML IJ SOLN
INTRAMUSCULAR | Status: AC | PRN
  Administered 2012-11-14: 150 mg via INTRAVENOUS

## 2012-11-14 MED ORDER — MIDAZOLAM BOLUS VIA INFUSION: 2.0000 mg | INTRAVENOUS | Status: DC | PRN

## 2012-11-14 MED ORDER — MIDAZOLAM HCL 2 MG/2ML IJ SOLN
2.0000 mg | Freq: Once | INTRAMUSCULAR | Status: AC
  Administered 2012-11-14: 2 mg via INTRAVENOUS

## 2012-11-14 MED ORDER — SODIUM CHLORIDE 0.9 % IV SOLN
10.0000 mg | INTRAVENOUS | Status: DC | PRN
  Administered 2012-11-14: 10 ug/min via INTRAVENOUS

## 2012-11-14 MED ORDER — MIDAZOLAM HCL 5 MG/5ML IJ SOLN
4.0000 mg | Freq: Once | INTRAMUSCULAR | Status: AC
  Administered 2012-11-14: 4 mg via INTRAVENOUS
  Filled 2012-11-14: qty 4

## 2012-11-14 MED ORDER — LIDOCAINE HCL (CARDIAC) 20 MG/ML IV SOLN
INTRAVENOUS | Status: AC
  Filled 2012-11-14: qty 5

## 2012-11-14 MED ORDER — ETOMIDATE 2 MG/ML IV SOLN
INTRAVENOUS | Status: AC | PRN
  Administered 2012-11-14: 20 mg via INTRAVENOUS

## 2012-11-14 MED ORDER — MIDAZOLAM HCL 2 MG/2ML IJ SOLN
INTRAMUSCULAR | Status: AC
  Filled 2012-11-14: qty 6

## 2012-11-14 MED ORDER — PANTOPRAZOLE SODIUM 40 MG IV SOLR
40.0000 mg | Freq: Every day | INTRAVENOUS | Status: DC
  Filled 2012-11-14: qty 40

## 2012-11-14 MED ORDER — NITROGLYCERIN 1 MG/10 ML FOR IR/CATH LAB
INTRA_ARTERIAL | Status: AC
  Filled 2012-11-14: qty 10

## 2012-11-14 MED ORDER — LABETALOL HCL 5 MG/ML IV SOLN
10.0000 mg | INTRAVENOUS | Status: DC | PRN
  Administered 2012-11-15: 10 mg via INTRAVENOUS
  Filled 2012-11-14: qty 4

## 2012-11-14 MED ORDER — MIDAZOLAM HCL 2 MG/2ML IJ SOLN
INTRAMUSCULAR | Status: AC
  Administered 2012-11-14: 2 mg
  Filled 2012-11-14: qty 8

## 2012-11-14 MED ORDER — DILTIAZEM HCL ER COATED BEADS 360 MG PO CP24: 360.0000 mg | ORAL_CAPSULE | Freq: Every day | ORAL | Status: DC

## 2012-11-14 MED ORDER — ACETAMINOPHEN 650 MG RE SUPP: 650.0000 mg | RECTAL | Status: DC | PRN

## 2012-11-14 MED ORDER — ROCURONIUM BROMIDE 100 MG/10ML IV SOLN
INTRAVENOUS | Status: DC | PRN
  Administered 2012-11-14: 50 mg via INTRAVENOUS

## 2012-11-14 MED ORDER — PYRIDOSTIGMINE BROMIDE 60 MG PO TABS
30.0000 mg | ORAL_TABLET | ORAL | Status: DC
  Administered 2012-11-15 – 2012-11-23 (×48): 30 mg via ORAL
  Filled 2012-11-14 (×59): qty 0.5

## 2012-11-14 MED ORDER — ETOMIDATE 2 MG/ML IV SOLN
INTRAVENOUS | Status: AC
  Filled 2012-11-14: qty 20

## 2012-11-14 MED ORDER — SODIUM CHLORIDE 0.9 % IV SOLN
2.0000 mg/h | INTRAVENOUS | Status: DC
  Filled 2012-11-14: qty 10

## 2012-11-14 MED ORDER — INSULIN ASPART 100 UNIT/ML ~~LOC~~ SOLN
2.0000 [IU] | SUBCUTANEOUS | Status: DC
  Administered 2012-11-15 (×2): 2 [IU] via SUBCUTANEOUS

## 2012-11-14 MED ORDER — MIDAZOLAM HCL 2 MG/2ML IJ SOLN: 2.0000 mg | Freq: Once | INTRAMUSCULAR | Status: DC

## 2012-11-14 MED ORDER — CEFAZOLIN SODIUM-DEXTROSE 2-3 GM-% IV SOLR
INTRAVENOUS | Status: AC
  Filled 2012-11-14: qty 50

## 2012-11-14 MED ORDER — LACTATED RINGERS IV SOLN
INTRAVENOUS | Status: DC | PRN
  Administered 2012-11-14: 22:00:00 via INTRAVENOUS

## 2012-11-14 MED ORDER — PROPOFOL 10 MG/ML IV BOLUS
INTRAVENOUS | Status: DC | PRN
  Administered 2012-11-14: 50 mg via INTRAVENOUS
  Administered 2012-11-15: 20 mg via INTRAVENOUS

## 2012-11-14 MED ORDER — PANCRELIPASE (LIP-PROT-AMYL) 12000-38000 UNITS PO CPEP
2.0000 | ORAL_CAPSULE | Freq: Three times a day (TID) | ORAL | Status: DC
  Administered 2012-11-15 – 2012-11-21 (×17): 2 via ORAL
  Filled 2012-11-14 (×28): qty 2

## 2012-11-14 MED ORDER — SODIUM CHLORIDE 0.9 % IV SOLN
INTRAVENOUS | Status: DC
  Administered 2012-11-14: 21:00:00 via INTRAVENOUS

## 2012-11-14 MED ORDER — ROCURONIUM BROMIDE 50 MG/5ML IV SOLN
INTRAVENOUS | Status: AC
  Filled 2012-11-14: qty 2

## 2012-11-14 MED ORDER — IOHEXOL 350 MG/ML SOLN
50.0000 mL | Freq: Once | INTRAVENOUS | Status: AC | PRN
  Administered 2012-11-14: 50 mL via INTRAVENOUS

## 2012-11-14 MED ORDER — ACETAMINOPHEN 325 MG PO TABS: 650.0000 mg | ORAL_TABLET | ORAL | Status: DC | PRN

## 2012-11-14 MED ORDER — MIDAZOLAM HCL 2 MG/2ML IJ SOLN
INTRAMUSCULAR | Status: AC
  Administered 2012-11-14: 2 mg via INTRAVENOUS
  Filled 2012-11-14: qty 2

## 2012-11-14 MED ORDER — CARBAMAZEPINE 200 MG PO TABS
200.0000 mg | ORAL_TABLET | Freq: Two times a day (BID) | ORAL | Status: DC
  Filled 2012-11-14: qty 1

## 2012-11-14 MED ORDER — CEFAZOLIN SODIUM-DEXTROSE 2-3 GM-% IV SOLR: 2.0000 g | Freq: Once | INTRAVENOUS | Status: DC

## 2012-11-14 MED ORDER — FENTANYL CITRATE 0.05 MG/ML IJ SOLN
INTRAMUSCULAR | Status: DC | PRN
  Administered 2012-11-14: 100 ug via INTRAVENOUS
  Administered 2012-11-15 (×3): 50 ug via INTRAVENOUS

## 2012-11-14 MED ORDER — SUCCINYLCHOLINE CHLORIDE 20 MG/ML IJ SOLN
INTRAMUSCULAR | Status: AC
  Filled 2012-11-14: qty 1

## 2012-11-14 MED ORDER — SODIUM CHLORIDE 0.9 % IV SOLN
INTRAVENOUS | Status: DC | PRN
  Administered 2012-11-14: 22:00:00 via INTRAVENOUS

## 2012-11-14 MED ORDER — CEFAZOLIN SODIUM-DEXTROSE 2-3 GM-% IV SOLR
INTRAVENOUS | Status: DC | PRN
  Administered 2012-11-14: 2 g via INTRAVENOUS

## 2012-11-14 MED ORDER — ARTIFICIAL TEARS OP OINT
TOPICAL_OINTMENT | OPHTHALMIC | Status: DC | PRN
  Administered 2012-11-14: 1 via OPHTHALMIC

## 2012-11-14 NOTE — ED Provider Notes (Signed)
History    CSN: [id number] Arrival date & time 11/14/12  4098  First MD Initiated Contact with Patient 11/14/12 1941     Chief Complaint  Patient presents with  . Code Stroke   (Consider location/radiation/quality/duration/timing/severity/associated sxs/prior Treatment) HPI Comments: Patient brought to the emergency department by [facility]. Patientwas reportedly found by his friend in his bed unresponsive. His last normal status was at 12 PM today.EMS found the patient in his bed, minimally responsive. He has not moved his left side. He is unable to enter any questions otherwise, no further information available. Level V Caveat applies.   Past Medical History  Diagnosis Date  . Persistent atrial fibrillation   . Type 2 diabetes mellitus   . Seizure disorder   . Myasthenia gravis   . Restless legs syndrome (RLS)   . Sick sinus syndrome     St. Jude Identity dual chamber pacemaker (DDDR) 4/03  . Coronary atherosclerosis of native coronary artery     Nonobstructive, catheterization [facility] 2006  . Mitral regurgitation     Mild  . Pneumonia   . Pneumothorax   . Recurrent UTI    Past Surgical History  Procedure Laterality Date  . Appendectomy    . Cholecystectomy    . Bladder cancer resection    . St. jude identity dual chamber pacemaker (dddr) 4/03      04/03 by Dr Hyacinth Meeker in [place], generator change by Fawn Kirk 10/15/10  . Revision urostomy cutaneous     Family History  Problem Relation Age of Onset  . Coronary artery disease Brother    History  Substance Use Topics  . Smoking status: Former Smoker -- 1.50 packs/day for 22 years    Types: Cigarettes    Quit date: 05/17/1970  . Smokeless tobacco: Never Used     Comment:  Pack years: 1-1&1/2 PPD 22years  . Alcohol Use: Yes     Comment: Rare occassions    Review of Systems  Unable to perform ROS: Acuity of condition    Allergies  Morphine  Home Medications   Current Outpatient Rx  Name  Route  Sig   Dispense  Refill  . atorvastatin (LIPITOR) 20 MG tablet   Oral   Take 20 mg by mouth daily.         . benazepril (LOTENSIN) 5 MG tablet   Oral   Take 5 mg by mouth daily.         Marland Kitchen diltiazem (CARDIZEM CD) 360 MG 24 hr capsule   Oral   Take 360 mg by mouth daily.         . furosemide (LASIX) 40 MG tablet   Oral   Take 40 mg by mouth daily.          Marland Kitchen glipiZIDE (GLUCOTROL XL) 5 MG 24 hr tablet   Oral   Take 5 mg by mouth 3 (three) times daily.         . Pancrelipase, Lip-Prot-Amyl, (CREON) 24000 UNITS CPEP   Oral   Take 2 capsules by mouth 3 (three) times daily before meals.           . Rivaroxaban (XARELTO) 15 MG TABS tablet   Oral   Take 1 tablet (15 mg total) by mouth daily.   30 tablet   6     Dose decrease   . rOPINIRole (REQUIP) 1 MG tablet   Oral   Take 1 mg by mouth at bedtime.          Marland Kitchen  zolpidem (AMBIEN) 10 MG tablet   Oral   Take 10 mg by mouth at bedtime.          . carbamazepine (CARBATROL) 200 MG 12 hr capsule   Oral   Take 200 mg by mouth 2 (two) times daily.           . fluticasone (FLONASE) 50 MCG/ACT nasal spray   Nasal   Place 2 sprays into the nose daily.          . insulin aspart (NOVOLOG) 100 UNIT/ML injection   Subcutaneous   Inject into the skin as directed.           . insulin glargine (LANTUS) 100 UNIT/ML injection   Subcutaneous   Inject 20 Units into the skin every morning.          . nystatin-triamcinolone (MYCOLOG II) cream   Topical   Apply 1 application topically 2 (two) times daily as needed.           Marland Kitchen omeprazole (PRILOSEC) 20 MG capsule   Oral   Take 20 mg by mouth 2 (two) times daily.           Marland Kitchen pyridostigmine (MESTINON) 180 MG CR tablet   Oral   Take 180 mg by mouth 2 (two) times daily.           BP 151/70  Pulse 74  SpO2 100% Physical Exam  Constitutional: He appears distressed.  HENT:  Head: Normocephalic and atraumatic.  Eyes: Right pupil is not reactive. Pupils are  unequal.  Neck: Neck supple.  Cardiovascular: An irregularly irregular rhythm present.  Pulmonary/Chest:  Coarse breath sounds, equal bilateral.  Abdominal: Soft. Bowel sounds are decreased.  Musculoskeletal: He exhibits no edema.  Neurological: A cranial nerve deficit is present. GCS eye subscore is 1. GCS verbal subscore is 2. GCS motor subscore is 4.  Right pupl non-reactive  Not moving right arm or leg at all  Skin: Skin is warm, dry and intact.    ED Course  Procedures (including critical care time)  Procedure: Intubation Permit was implied secondary to emergent situation. A GlideScope blade was inserted into the oropharynx at which time the vocal cords were visualized. A 7.5-French endotracheal tube was inserted and visualized going through the vocal cords. The stylette was removed. Colorimetric change was visualized on the CO2 meter. Breath sounds were heard in both lung fields equally. The endotracheal tube was placed at 22 cm, measured at the teeth.  Intubation was performed by Doctor Gary Fleet, resident physician under my direct supervision   Labs Reviewed  POCT I-STAT, CHEM 8 - Abnormal; Notable for the following:    Creatinine, Ser 1.40 (*)    Glucose, Bld 163 (*)    Hemoglobin 12.9 (*)    HCT 38.0 (*)    All other components within normal limits  PROTIME-INR  APTT  CBC  DIFFERENTIAL  COMPREHENSIVE METABOLIC PANEL  TROPONIN I   Ct Head (brain) Wo Contrast  11/14/2012   *RADIOLOGY REPORT*  Clinical Data: Patient unresponsive.  CT HEAD WITHOUT CONTRAST  Technique:  Contiguous axial images were obtained from the base of the skull through the vertex without contrast.  Comparison: None.  Findings: There is some hypoattenuation in the subcortical and periventricular deep white matter.  No evidence of acute abnormality including infarction, hemorrhage, mass lesion, mass effect, midline shift or abnormal extra-axial fluid collection is identified.  There is no hydrocephalus or  pneumocephalus.  The calvarium is intact.  IMPRESSION:  No acute finding.  Chronic microvascular ischemic change.  Critical Value/emergent results were called by telephone at the time of interpretation on 11/14/2012 at 8:00 p.m. to Dr. Amada Jupiter, who verbally acknowledged these results.   Original Report Authenticated By: Holley Dexter, M.D.   Diagnosis: Acute CVA  MDM  Patient into the ER as a code stroke. Patient had dilated and fixed right pupil with dense left hemiparesis. This was initially concerning for possible bleed, as the patient is on sotalol to period CAT scan, however, did not show any evidence of bleed. Patient was felt to be a candidate for interventional radiology, admitted by neurology service.  Gilda Crease, MD 11/16/12 [phone]

## 2012-11-14 NOTE — Progress Notes (Signed)
Patient brought from CT.  RN called for RT to intubate.  Patient was a code stroke.  No complications with intubation.  Patient taken to CT immediately after intubation without complications.

## 2012-11-14 NOTE — Anesthesia Preprocedure Evaluation (Signed)
Anesthesia Evaluation   Patient unresponsive    Airway       Dental   Pulmonary unresolved,  Intubated from ED + rhonchi         Cardiovascular hypertension, + CAD + dysrhythmias Atrial Fibrillation Rhythm:Irregular Rate:Normal     Neuro/Psych Myasthenia Gravis  Neuromuscular disease    GI/Hepatic   Endo/Other  diabetes, Type 2, Oral Hypoglycemic Agents  Renal/GU      Musculoskeletal   Abdominal   Peds  Hematology   Anesthesia Other Findings   Reproductive/Obstetrics                           Anesthesia Physical Anesthesia Plan  ASA: IV and emergent  Anesthesia Plan: General   Post-op Pain Management:    Induction:   Airway Management Planned: Oral ETT  Additional Equipment: Arterial line  Intra-op Plan:   Post-operative Plan: Post-operative intubation/ventilation  Informed Consent: I have reviewed the patients History and Physical, chart, labs and discussed the procedure including the risks, benefits and alternatives for the proposed anesthesia with the patient or authorized representative who has indicated his/her understanding and acceptance.   Dental advisory given  Plan Discussed with: CRNA  Anesthesia Plan Comments:         Anesthesia Quick Evaluation

## 2012-11-14 NOTE — Consult Note (Signed)
PULMONARY  / CRITICAL CARE MEDICINE  Name: Frederick [name] MRN: [id number] DOB: May 14, 1941    ADMISSION DATE:  11/14/2012 CONSULTATION DATE:  11/14/2012  REFERRING MD :  Stroke PRIMARY SERVICE:  Stroke  CHIEF COMPLAINT:  Acute respiratory failure  BRIEF PATIENT DESCRIPTION: 72 yo with past medical history of AF, DM, myasthenia gravis and seizures brought to [facility] ED with complaints of anisocoria, right gaze deviation, dysarthria and left sided weakness.  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 Out of the window for tPA.  To IR for intervention.  Brought back intubated.  PCCM was consulted.   SIGNIFICANT EVENTS / STUDIES:  7/1 IR >>> Bilateral vert artery and bilateral common carotid aretry angiograms ,followed by partial angiographic recanalization of both PCAs and basilar artery occusion using 2 passes each with trevoprovue and Solitaire devices and 9.6 mg of Integrelin IA   LINES / TUBES: OETT 7/1 >>> OGT 7/1 >>> Foley 7/2 >>> R fem art sheath 7/1 >>> R rad art line 7/1 >>>  CULTURES:  ANTIBIOTICS:  The patient is encephalopathic and unable to provide history, which was obtained for available medical records.  HISTORY OF PRESENT ILLNESS:  72 yo with past medical history of AF, DM, myasthenia gravis and seizures brought to [facility] ED with complaints of anisocoria, right gaze deviation, dysarthria and left sided weakness.  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 Out of the window for tPA.  To IR for intervention.  Brought back intubated.  PCCM was consulted.   PAST MEDICAL HISTORY :  Past Medical History  Diagnosis Date  . Persistent atrial fibrillation   . Type 2 diabetes mellitus   . Seizure disorder   . Myasthenia gravis   . Restless legs syndrome (RLS)   . Sick sinus syndrome     St. Jude Identity dual chamber pacemaker (DDDR) 4/03  . Coronary atherosclerosis of native coronary artery     Nonobstructive, catheterization [facility]  2006  . Mitral regurgitation     Mild  . Pneumonia   . Pneumothorax   . Recurrent UTI    Past Surgical History  Procedure Laterality Date  . Appendectomy    . Cholecystectomy    . Bladder cancer resection    . St. jude identity dual chamber pacemaker (dddr) 4/03      04/03 by Dr Hyacinth Meeker in [place], generator change by Fawn Kirk 10/15/10  . Revision urostomy cutaneous     Prior to Admission medications   Medication Sig Start Date End Date Taking? Authorizing Provider  atorvastatin (LIPITOR) 20 MG tablet Take 20 mg by mouth daily.   Yes Historical Provider, MD  benazepril (LOTENSIN) 5 MG tablet Take 5 mg by mouth daily.   Yes Historical Provider, MD  diltiazem (CARDIZEM CD) 360 MG 24 hr capsule Take 360 mg by mouth daily.   Yes Historical Provider, MD  furosemide (LASIX) 40 MG tablet Take 40 mg by mouth daily.    Yes Historical Provider, MD  glipiZIDE (GLUCOTROL XL) 5 MG 24 hr tablet Take 5 mg by mouth 3 (three) times daily.   Yes Historical Provider, MD  Pancrelipase, Lip-Prot-Amyl, (CREON) 24000 UNITS CPEP Take 2 capsules by mouth 3 (three) times daily before meals.     Yes Historical Provider, MD  Rivaroxaban (XARELTO) 15 MG TABS tablet Take 1 tablet (15 mg total) by mouth daily. 08/28/12  Yes Jonelle Sidle, MD  rOPINIRole (REQUIP) 1 MG tablet Take 1 mg by mouth at bedtime.  Yes Historical Provider, MD  zolpidem (AMBIEN) 10 MG tablet Take 10 mg by mouth at bedtime.    Yes Historical Provider, MD  carbamazepine (CARBATROL) 200 MG 12 hr capsule Take 200 mg by mouth 2 (two) times daily.      Historical Provider, MD  fluticasone (FLONASE) 50 MCG/ACT nasal spray Place 2 sprays into the nose daily.  08/14/12   Historical Provider, MD  insulin aspart (NOVOLOG) 100 UNIT/ML injection Inject into the skin as directed.      Historical Provider, MD  insulin glargine (LANTUS) 100 UNIT/ML injection Inject 20 Units into the skin every morning.     Historical Provider, MD  nystatin-triamcinolone (MYCOLOG  II) cream Apply 1 application topically 2 (two) times daily as needed.      Historical Provider, MD  omeprazole (PRILOSEC) 20 MG capsule Take 20 mg by mouth 2 (two) times daily.      Historical Provider, MD  pyridostigmine (MESTINON) 180 MG CR tablet Take 180 mg by mouth 2 (two) times daily.     Historical Provider, MD   Allergies  Allergen Reactions  . Morphine     REACTION: rash   FAMILY HISTORY:  Family History  Problem Relation Age of Onset  . Coronary artery disease Brother    SOCIAL HISTORY:  reports that he quit smoking about 42 years ago. His smoking use included Cigarettes. He has a 33 pack-year smoking history. He has never used smokeless tobacco. He reports that  drinks alcohol. He reports that he does not use illicit drugs.  REVIEW OF SYSTEMS:  Unable to provide.  INTERVAL HISTORY:  VITAL SIGNS: Pulse Rate:  [74-105] 98 (07/01 2135) Resp:  [18-27] 20 (07/01 2135) BP: (130-203)/(68-104) 154/82 mmHg (07/01 2135) SpO2:  [94 %-100 %] 98 % (07/01 2135) FiO2 (%):  [60 %-100 %] 100 % (07/01 2056)  HEMODYNAMICS:   VENTILATOR SETTINGS: Vent Mode:  [-] PRVC FiO2 (%):  [60 %-100 %] 100 % Set Rate:  [18 bmp] 18 bmp Vt Set:  [500 mL] 500 mL PEEP:  [5 cmH20] 5 cmH20 Plateau Pressure:  [16 cmH20] 16 cmH20  INTAKE / OUTPUT: Intake/Output     07/01 0701 - 07/02 0700   Urine 500   Total Output 500   Net -500        PHYSICAL EXAMINATION: General:  No distress, synchronous Neuro:  Well sedated, gag / cough diminished HEENT:  PERRL, OETT Cardiovascular:  Irregular, no murmurs Lungs:  Bilateral air entry, no w/r/r Abdomen:  Soft, nontender, bowel sounds diminished Musculoskeletal:  No edema Skin:  Intact  LABS:  Recent Labs Lab 11/14/12 2002 11/14/12 2003 11/14/12 2008 11/14/12 2051  HGB 13.2  --  12.9*  --   WBC 6.1  --   --   --   PLT 148*  --   --   --   NA 139  --  142  --   K 3.5  --  3.6  --   CL 106  --  106  --   CO2 23  --   --   --   GLUCOSE 164*   --  163*  --   BUN 14  --  14  --   CREATININE 1.37*  --  1.40*  --   CALCIUM 8.7  --   --   --   AST 19  --   --   --   ALT 14  --   --   --  ALKPHOS 89  --   --   --   BILITOT 0.9  --   --   --   PROT 6.3  --   --   --   ALBUMIN 3.4*  --   --   --   APTT 34  --   --   --   INR 1.20  --   --   --   TROPONINI  --  <0.30  --   --   PHART  --   --   --  7.325*  PCO2ART  --   --   --  43.1  PO2ART  --   --   --  62.0*   No results found for this basename: GLUCAP,  in the last 168 hours  CXR:  7/2 >>>  ASSESSMENT / PLAN:  PULMONARY A:  Acute respiratory failure due to inability to protect airway P:   Goal SpO2>92, pH>7.30 Full mechanical support Daily SBT Trend ABG / CXR  CARDIOVASCULAR A: Hemodynamically stable. H/o AF. P:  Goal MAP per Stroke team Labetalol / Nicardipine PRN When able - Lipitor, Cardizem  RENAL A:  Renal failure (acute / chronic) P:   Trend BMP Replete K PRN NS@75cc /hr Hold Lasix  GASTROINTESTINAL A:  No acute issues. P:   NPO as intubated TF if remains intubated > 24 hours Protonix for GI Px  HEMATOLOGIC A:  No acute issues. P:  Trend CBC SCD for DVT Px Reassess for Xarelto  INFECTIOUS A:  No acute issues P:   No intervention required  ENDOCRINE  A:  DM2 with vascular complications.   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  Myasthenia gravis.  Seizure disorder (no seizures this admission). P:   Per Neurology / Stroke Goal RASS 0 to -1 Propofol gtt / Versed gtt Preadmission Tegretol / Mestinon Hold Ambien, Requip  TODAY'S SUMMARY: Acute CVA s/p IR procedure, now VDRF - continue full vent support and place on propofol drip.  I have personally obtained a history, examined the patient, evaluated laboratory and imaging results, formulated the assessment and plan and placed  orders.  CRITICAL CARE:  The patient is critically ill with multiple organ systems failure and requires high complexity decision making for assessment and support, frequent evaluation and titration of therapies, application of advanced monitoring technologies and extensive interpretation of multiple databases. Critical Care Time devoted to patient care services described in this note is 60 minutes.   Lonia Farber, MD Pulmonary and Critical Care Medicine [facility] Pager: [phone]  11/14/2012, 10:54 PM

## 2012-11-14 NOTE — ED Notes (Signed)
Pt has hx of afib, found in bed by friend in bed disoriented. Right eye unresponsive. Pt incoherent. Left side weakness.

## 2012-11-14 NOTE — ED Notes (Signed)
Dr Corliss Skains and Dr Amada Jupiter spoke with son via telephone. Updated on findings of angiogram. Consent given to proceed with mechanical thrombectomy.

## 2012-11-14 NOTE — ED Notes (Signed)
Pt ventilated.  Has received Versed for sedation.  Unable to follow commands.  Moving right arm, wrist restraint applied.  Right pupil abnormal per CRNA.  No purposeful movement.

## 2012-11-14 NOTE — H&P (Addendum)
Neurology H&P   CC: decreased LOC  History is obtained from:EMS, friend  HPI: Frederick [name] is a 72 y.o. male with a history of seizure disorder, atrial fibrillation, myasthenia gravis, sick sinus syndrome status post pacemaker, diabetes who was last seen normal definitively at 57 PM. The friend saw him at that time, and then when outside in the sitting in the front porch.  At 6 PM, the friend her the patient cry out and went into see him. The patient stated that something was very wrong, and was slumped over to the left. The patient stated that the friend needed to call 911 and therefore this was done.  On arrival to the ED, the patient was not reliably following commands, had a blown pupil with an hour the deviated on the right and his responses were unintelligible. 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  Given this finding, he was taken for interventional radiology to try and reestablishes posterior circulation. Given that was posterior circulation, an extended window for intervention was used.   LKW: 12 PM, though very likely this started when the friend cried out at 6 PM tpa given: no, on xarelto, outside of window NIH SS: 18 MRS: 1    ROS: Unable to assess secondary to patient's altered mental status.    Past Medical History  Diagnosis Date  . Persistent atrial fibrillation   . Type 2 diabetes mellitus   . Seizure disorder   . Myasthenia gravis   . Restless legs syndrome (RLS)   . Sick sinus syndrome     St. Jude Identity dual chamber pacemaker (DDDR) 4/03  . Coronary atherosclerosis of native coronary artery     Nonobstructive, catheterization [facility] 2006  . Mitral regurgitation     Mild  . Pneumonia   . Pneumothorax   . Recurrent UTI     Family History: Unable to assess secondary to patient's altered mental status.   Social  History: Tob: Unable to assess secondary to patient's altered mental status.    Exam: Current vital signs: BP 154/82  Pulse 98  Resp 20  SpO2 98% Vital signs in last 24 hours: Pulse Rate:  [74-105] 98 (07/01 2135) Resp:  [18-27] 20 (07/01 2135) BP: (130-203)/(68-104) 154/82 mmHg (07/01 2135) SpO2:  [94 %-100 %] 98 % (07/01 2135) FiO2 (%):  [60 %-100 %] 100 % (07/01 2056)  General: on stretcher, appears lethargic CV: irregular Abd: soft, NT Ext: dry, well perfused. Mental Status: Patient awakens to noxiouse stimulation, appears to intermittently follow commands, but not reliably. Speech is unintelligible,  Cranial Nerves: II: No response to visual stimuli. Pupils are 6mm and nonreactive on right, 2mm and reactive on left.  III,IV, VI: right pupil is outwardly devated and down, left eye is midline  V/VII: corneal absent on right, weak on left VIII, X, XI, XII: Unable to assess secondary to patient's altered mental status.  Motor: Tone is normal. Bulk is normal. Moves right side well, left side, moves leg against gravity, but has weakness, small amount of movement left arm.  Sensory: Responds briskly to nox stim throughout.  Deep Tendon Reflexes: 2+ and symmetric in the biceps and patellae.  Cerebellar: Unable to assess secondary to patient's altered mental status.  Gait: Unable to assess secondary to patient's altered mental status.   I have reviewed labs in epic and the results pertinent to  this consultation are: Elevated CR - 1.4 CBC unremarkable  I have reviewed the images obtained:CTA head, nonocclusive thrombus in the basilar  Impression: 72 year old male with nonocclusive basilar thrombosis. At this time I hope that he is still perfusing enough round and to maintain his thalamic circulation, and feel that his mortality without intervention to be very high. I discussed the case with Dr. Corliss Skains and the patient is getting an angiogram.  Of note has sz disorder and  myasthenia gravis as well.   Recommendations: 1. HgbA1c, fasting lipid panel 2. CT tomorrow(has pacemaker) 3. Frequent neuro checks 4. Echocardiogram 5. Carotid dopplers 6. Prophylactic therapy-ASA following procedure, will need anticoagulation long term.  7. Risk factor modification 8. Telemetry monitoring 9. PT consult, OT consult, Speech consult 10. Admit to neuroICU 11. ICU hyperglycemia prtocol for DM 12. Will give home rate control diltiazem  13. For sz disorder, change carbatrol to tegretol 200mg  Q12h(cannot crush carbatrol) 14. For MG, normally takes 180mg  timespan BID, will give 30mg  Q4h   This patient is critically ill and at significant risk of neurological worsening, death and care requires constant monitoring of vital signs, hemodynamics,respiratory and cardiac monitoring, neurological assessment, discussion with family, other specialists and medical decision making of high complexity. I spent 60 minutes of neurocritical care time  in the care of  this patient.  Ritta Slot, MD [facility] [phone]  If 7pm- 7am, please page neurology on call at [phone].  11/14/2012  10:39 PM

## 2012-11-14 NOTE — Code Documentation (Signed)
Patient in normal state of health per friend around 1200, patient's friend arrived around 1800 and noticed patient with slurred speech and slumped over to the side. [facility] called by friend. Code stroke called at 1915, patient arrived to ED at 1937, [place] at 1200, EDP exam at 1939, stroke team arrived at 1929, neurologist arrived at 84, patient arrived to CT at 1940, phlebotomist arrived at 1929, CT read by neurologist at 47. Baseline NIH 18, upon arrival to room patient having difficulty maintaining airway, intubated by EDP. Patient then transported for CTA. Patient received a total of 6mg  IV versed during scan. ICU bed requested at 2057, IR notified at 2100, patient taken to IR with bedside RN and IR RN. To 3100 after procedure.

## 2012-11-14 NOTE — ED Notes (Signed)
In IR suite.  Anesthesia here assuming care.  See anesthesia for VS.

## 2012-11-14 NOTE — ED Notes (Signed)
Pt returned from CT. Stroke team, RT resident and dr. Blinda Leatherwood at bedside

## 2012-11-14 NOTE — Progress Notes (Signed)
Per ABG results.  Patient placed on 100% FIO2. ABG    Component Value Date/Time   PHART 7.325* 11/14/2012 2051   PCO2ART 43.1 11/14/2012 2051   PO2ART 62.0* 11/14/2012 2051   HCO3 22.5 11/14/2012 2051   TCO2 24 11/14/2012 2051   ACIDBASEDEF 3.0* 11/14/2012 2051   O2SAT 89.0 11/14/2012 2051

## 2012-11-15 ENCOUNTER — Encounter (HOSPITAL_COMMUNITY): Payer: Self-pay | Admitting: Radiology

## 2012-11-15 ENCOUNTER — Inpatient Hospital Stay (HOSPITAL_COMMUNITY): Payer: Medicare Other

## 2012-11-15 DIAGNOSIS — I517 Cardiomegaly: Secondary | ICD-10-CM

## 2012-11-15 DIAGNOSIS — J9600 Acute respiratory failure, unspecified whether with hypoxia or hypercapnia: Secondary | ICD-10-CM

## 2012-11-15 LAB — BASIC METABOLIC PANEL
GFR calc Af Amer: 74 mL/min — ABNORMAL LOW (ref >90)
GFR calc non Af Amer: 64 mL/min — ABNORMAL LOW (ref >90)
Glucose, Bld: 134 mg/dL — ABNORMAL HIGH (ref 70–99)
Potassium: 3 mEq/L — ABNORMAL LOW (ref 3.5–5.1)
Sodium: 141 mEq/L (ref 135–145)

## 2012-11-15 LAB — GLUCOSE, CAPILLARY
Glucose-Capillary: 130 mg/dL — ABNORMAL HIGH (ref 70–99)
Glucose-Capillary: 130 mg/dL — ABNORMAL HIGH (ref 70–99)
Glucose-Capillary: 162 mg/dL — ABNORMAL HIGH (ref 70–99)
Glucose-Capillary: 148 mg/dL — ABNORMAL HIGH (ref 70–99)
Glucose-Capillary: 125 mg/dL — ABNORMAL HIGH (ref 70–99)

## 2012-11-15 LAB — CBC WITH DIFFERENTIAL/PLATELET
Basophils Absolute: 0 10*3/uL (ref 0.0–0.1)
Eosinophils Relative: 3 % (ref 0–5)
Lymphocytes Relative: 19 % (ref 12–46)
Lymphs Abs: 1 10*3/uL (ref 0.7–4.0)
MCV: 86.9 fL (ref 78.0–100.0)
Neutro Abs: 3.5 10*3/uL (ref 1.7–7.7)
Platelets: 120 10*3/uL — ABNORMAL LOW (ref 150–400)
RBC: 3.6 MIL/uL — ABNORMAL LOW (ref 4.22–5.81)
WBC: 5.1 10*3/uL (ref 4.0–10.5)

## 2012-11-15 LAB — LIPID PANEL
HDL: 20 mg/dL — ABNORMAL LOW (ref >39)
LDL Cholesterol: 39 mg/dL (ref 0–99)
Total CHOL/HDL Ratio: 5 RATIO
VLDL: 41 mg/dL — ABNORMAL HIGH (ref 0–40)

## 2012-11-15 LAB — MRSA PCR SCREENING: MRSA by PCR: NEGATIVE

## 2012-11-15 MED ORDER — CHLORHEXIDINE GLUCONATE 0.12 % MT SOLN
15.0000 mL | Freq: Two times a day (BID) | OROMUCOSAL | Status: DC
  Administered 2012-11-15 – 2012-11-23 (×16): 15 mL via OROMUCOSAL
  Filled 2012-11-15 (×21): qty 15

## 2012-11-15 MED ORDER — SODIUM CHLORIDE 0.9 % IV SOLN
INTRAVENOUS | Status: DC
  Administered 2012-11-15 – 2012-11-20 (×2): via INTRAVENOUS

## 2012-11-15 MED ORDER — EPTIFIBATIDE 2 MG/ML IV SOLN
INTRAVENOUS | Status: AC | PRN
  Administered 2012-11-15: 2 mg
  Administered 2012-11-15: 3 mg
  Administered 2012-11-15 (×2): 1.8 mg
  Administered 2012-11-15: 1 mg

## 2012-11-15 MED ORDER — ONDANSETRON HCL 4 MG/2ML IJ SOLN: 4.0000 mg | Freq: Four times a day (QID) | INTRAMUSCULAR | Status: DC | PRN

## 2012-11-15 MED ORDER — PROPOFOL INFUSION 10 MG/ML OPTIME
INTRAVENOUS | Status: DC | PRN
  Administered 2012-11-15: 50 ug/kg/min via INTRAVENOUS

## 2012-11-15 MED ORDER — PANTOPRAZOLE SODIUM 40 MG PO PACK
40.0000 mg | PACK | ORAL | Status: DC
  Administered 2012-11-15 – 2012-11-20 (×5): 40 mg
  Filled 2012-11-15 (×7): qty 20

## 2012-11-15 MED ORDER — ASPIRIN 300 MG RE SUPP
300.0000 mg | Freq: Every day | RECTAL | Status: DC
  Administered 2012-11-15: 300 mg via RECTAL
  Filled 2012-11-15 (×2): qty 1

## 2012-11-15 MED ORDER — EPTIFIBATIDE 2 MG/ML IV SOLN
INTRAVENOUS | Status: AC
  Filled 2012-11-15: qty 10

## 2012-11-15 MED ORDER — ATORVASTATIN CALCIUM 20 MG PO TABS
20.0000 mg | ORAL_TABLET | Freq: Every day | ORAL | Status: DC
  Administered 2012-11-15 – 2012-11-23 (×9): 20 mg
  Filled 2012-11-15 (×10): qty 1

## 2012-11-15 MED ORDER — CARBAMAZEPINE 200 MG PO TABS
200.0000 mg | ORAL_TABLET | Freq: Two times a day (BID) | ORAL | Status: DC
  Administered 2012-11-15 – 2012-11-23 (×18): 200 mg
  Filled 2012-11-15 (×21): qty 1

## 2012-11-15 MED ORDER — BIOTENE DRY MOUTH MT LIQD
15.0000 mL | Freq: Four times a day (QID) | OROMUCOSAL | Status: DC
  Administered 2012-11-15 – 2012-11-23 (×33): 15 mL via OROMUCOSAL

## 2012-11-15 MED ORDER — FENTANYL CITRATE 0.05 MG/ML IJ SOLN
25.0000 ug | INTRAMUSCULAR | Status: DC | PRN
Start: 2012-11-15 — End: 2012-11-16

## 2012-11-15 MED ORDER — PROPOFOL 10 MG/ML IV EMUL
5.0000 ug/kg/min | INTRAVENOUS | Status: DC
  Administered 2012-11-15: 5 ug/kg/min via INTRAVENOUS
  Administered 2012-11-15: 25 ug/kg/min via INTRAVENOUS
  Filled 2012-11-15 (×2): qty 100

## 2012-11-15 MED ORDER — IOHEXOL 300 MG/ML  SOLN
150.0000 mL | Freq: Once | INTRAMUSCULAR | Status: AC | PRN
  Administered 2012-11-15: 160 mL via INTRAVENOUS

## 2012-11-15 MED ORDER — ACETAMINOPHEN 500 MG PO TABS
1000.0000 mg | ORAL_TABLET | Freq: Four times a day (QID) | ORAL | Status: DC | PRN
  Administered 2012-11-15: 1000 mg via ORAL
  Filled 2012-11-15: qty 2

## 2012-11-15 MED ORDER — NICARDIPINE HCL IN NACL 20-0.86 MG/200ML-% IV SOLN
5.0000 mg/h | INTRAVENOUS | Status: DC
  Filled 2012-11-15: qty 200

## 2012-11-15 MED ORDER — INSULIN ASPART 100 UNIT/ML ~~LOC~~ SOLN
0.0000 [IU] | SUBCUTANEOUS | Status: DC
  Administered 2012-11-15: 3 [IU] via SUBCUTANEOUS
  Administered 2012-11-15: 4 [IU] via SUBCUTANEOUS
  Administered 2012-11-15 – 2012-11-16 (×6): 3 [IU] via SUBCUTANEOUS
  Administered 2012-11-17 (×3): 4 [IU] via SUBCUTANEOUS
  Administered 2012-11-17: 3 [IU] via SUBCUTANEOUS
  Administered 2012-11-17 – 2012-11-18 (×3): 4 [IU] via SUBCUTANEOUS
  Administered 2012-11-18 (×2): 7 [IU] via SUBCUTANEOUS
  Administered 2012-11-18 (×2): 4 [IU] via SUBCUTANEOUS
  Administered 2012-11-18 – 2012-11-19 (×2): 7 [IU] via SUBCUTANEOUS
  Administered 2012-11-19: 3 [IU] via SUBCUTANEOUS
  Administered 2012-11-19 (×2): 4 [IU] via SUBCUTANEOUS
  Administered 2012-11-19: 3 [IU] via SUBCUTANEOUS
  Administered 2012-11-19: 4 [IU] via SUBCUTANEOUS
  Administered 2012-11-20: 7 [IU] via SUBCUTANEOUS
  Administered 2012-11-20: 4 [IU] via SUBCUTANEOUS
  Administered 2012-11-20: 11 [IU] via SUBCUTANEOUS
  Administered 2012-11-20: 4 [IU] via SUBCUTANEOUS
  Administered 2012-11-20: 7 [IU] via SUBCUTANEOUS
  Administered 2012-11-21 (×2): 4 [IU] via SUBCUTANEOUS
  Administered 2012-11-21: 7 [IU] via SUBCUTANEOUS
  Administered 2012-11-21 (×3): 4 [IU] via SUBCUTANEOUS
  Administered 2012-11-22: 11 [IU] via SUBCUTANEOUS
  Administered 2012-11-22: 7 [IU] via SUBCUTANEOUS
  Administered 2012-11-22: 4 [IU] via SUBCUTANEOUS
  Administered 2012-11-22: 7 [IU] via SUBCUTANEOUS
  Administered 2012-11-22 – 2012-11-23 (×2): 4 [IU] via SUBCUTANEOUS
  Administered 2012-11-23: 3 [IU] via SUBCUTANEOUS
  Administered 2012-11-23 (×3): 4 [IU] via SUBCUTANEOUS

## 2012-11-15 MED ORDER — ACETAMINOPHEN 650 MG RE SUPP: 650.0000 mg | Freq: Four times a day (QID) | RECTAL | Status: DC | PRN

## 2012-11-15 NOTE — Procedures (Signed)
S/P bilateral vert artery and bilateral common carotid aretry angiograms ,followed by partial angiographic recanalization of both PCAs and basilar artery occusion using 2 passes each with trevoprovue and Solitaire devices and 9.6 mg of Integrelin IA

## 2012-11-15 NOTE — Transfer of Care (Signed)
Immediate Anesthesia Transfer of Care Note  Patient: Frederick [name]  Procedure(s) Performed: Procedure(s): RADIOLOGY WITH ANESTHESIA (N/A)  Patient Location: NICU  Anesthesia Type:General  Level of Consciousness: sedated and Patient remains intubated per anesthesia plan  Airway & Oxygen Therapy: Patient remains intubated per anesthesia plan and Patient placed on Ventilator (see vital sign flow sheet for setting)  Post-op Assessment: Post -op Vital signs reviewed and stable  Post vital signs: Reviewed and stable  Complications: No apparent anesthesia complications

## 2012-11-15 NOTE — Progress Notes (Signed)
  Echocardiogram 2D Echocardiogram has been performed.  Arvil Chaco 11/15/2012, 2:09 PM

## 2012-11-15 NOTE — Progress Notes (Signed)
Right RFA 7 french sheath removed at 1000 by Pearlean Brownie, RTRCV. Exoseal device utilized. Manual pressure applied. Hemostasis confirmed at 1020. Pressure dressing and sandbag applied. No complications; distal pulses intact. Verbal order by Dr. Corliss Skains to prolong head of bed flat for two more hours. RN at bedside. Will continue to monitor.

## 2012-11-15 NOTE — Progress Notes (Signed)
The patient is decerebrate and the left pupil has become a size 5 and nonreactive.  Annie Main, NP notified; STAT head CT ordered. Will continue to monitor.

## 2012-11-15 NOTE — Progress Notes (Signed)
eLink Physician-Brief Progress Note Patient Name: Frederick [name] DOB: May 07, 1941 MRN: [id number]  Date of Service  11/15/2012   HPI/Events of Note   Pt seen on camera assessment s/p IR procedure to open both PCAs and basilar artery s/p CVA  eICU Interventions  Cont full vent support, see orders and full pccm note to follow   Intervention Category Major Interventions: Acid-Base disturbance - evaluation and management;Respiratory failure - evaluation and management  Shan Levans 11/15/2012, 2:34 AM

## 2012-11-15 NOTE — Progress Notes (Signed)
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  Remained on vent post-procedure and PCCM consulted.   SIGNIFICANT EVENTS: 7/01 vertebral/carotid angiogram >> recanalization of b/l PCA and basilar arteries  STUDIES:  7/01 CT head >> no acute findings 7/01 CT angio head/neck >> partial occlusive thrombus of basilar artery  LINES / TUBES: OETT 7/1 >>> R fem art sheath 7/1 >>> R rad art line 7/1 >>>  ANTIBIOTICS: Ancef 7/01 >>   INTERVAL HISTORY: Tolerating pressure support.   VITAL SIGNS: Temp:  [95.7 F (35.4 C)-97.6 F (36.4 C)] 97.6 F (36.4 C) (07/02 0700) Pulse Rate:  [70-105] 79 (07/02 0800) Resp:  [14-27] 17 (07/02 0800) BP: (102-203)/(54-104) 115/58 mmHg (07/02 0800) SpO2:  [94 %-100 %] 100 % (07/02 0800) Arterial Line BP: (69)/(58) 69/58 mmHg (07/02 0800) FiO2 (%):  [60 %-100 %] 100 % (07/02 0800) Weight:  [232 lb 12.9 oz (105.6 kg)] 232 lb 12.9 oz (105.6 kg) (07/02 0215) VENTILATOR SETTINGS: Vent Mode:  [-] PRVC FiO2 (%):  [60 %-100 %] 100 % Set Rate:  [14 bmp-18 bmp] 14 bmp Vt Set:  [500 mL] 500 mL PEEP:  [5 cmH20] 5 cmH20 Plateau Pressure:  [10 cmH20-17 cmH20] 10 cmH20  INTAKE / OUTPUT: Intake/Output     07/01 0701 - 07/02 0700 07/02 0701 - 07/03 0700   I.V. (mL/kg) 1612.3 (15.3) 75 (0.7)   Total Intake(mL/kg) 1612.3 (15.3) 75 (0.7)   Urine (mL/kg/hr) 900 550 (2.5)   Total Output 900 550   Net +712.3 -475         PHYSICAL EXAMINATION: General:  No distress, synchronous Neuro:  Well sedated, gag / cough diminished HEENT:  PERRL, OETT Cardiovascular:  Irregular, no murmurs Lungs:  Bilateral air entry, no w/r/r Abdomen:   Soft, nontender, bowel sounds diminished Musculoskeletal:  No edema Skin:  Intact  LABS: CBC Recent Labs     11/14/12  2002  11/14/12  2008  11/15/12  0611  WBC  6.1   --   5.1  HGB  13.2  12.9*  11.1*  HCT  37.2*  38.0*  31.3*  PLT  148*   --   120*    Coag's Recent Labs     11/14/12  2002  APTT  34  INR  1.20    BMET Recent Labs     11/14/12  2002  11/14/12  2008  11/15/12  0611  NA  139  142  141  K  3.5  3.6  3.0*  CL  106  106  109  CO2  23   --   24  BUN  14  14  11   CREATININE  1.37*  1.40*  1.10  GLUCOSE  164*  163*  134*    Electrolytes Recent Labs     11/14/12  2002  11/15/12  0611  CALCIUM  8.7  7.7*    Sepsis Markers No results found for this basename: LACTICACIDVEN, PROCALCITON, O2SATVEN,  in the last 72 hours  ABG Recent Labs     11/14/12  2051  PHART  7.325*  PCO2ART  43.1  PO2ART  62.0*    Liver Enzymes Recent Labs  11/14/12  2002  AST  19  ALT  14  ALKPHOS  89  BILITOT  0.9  ALBUMIN  3.4*    Cardiac Enzymes Recent Labs     11/14/12  2003  TROPONINI  <0.30    Glucose Recent Labs     11/15/12  0349  11/15/12  0737  GLUCAP  130*  130*   Lipid Panel     Component Value Date/Time   CHOL 100 11/15/2012 0611   TRIG 205* 11/15/2012 0611   HDL 20* 11/15/2012 0611   CHOLHDL 5.0 11/15/2012 0611   VLDL 41* 11/15/2012 0611   LDLCALC 39 11/15/2012 0611     Imaging Ct Angio Head W/cm &/or Wo Cm  11/14/2012   *RADIOLOGY REPORT*  Clinical Data: History of atrial fibrillation, unresponsive right eye, concern for basilar thrombosis.  CT ANGIOGRAPHY HEAD AND NECK  Technique:  Multidetector CT imaging of the head and neck was performed using the standard protocol during bolus administration of intravenous contrast.  Multiplanar CT image reconstructions including MIPs were obtained to evaluate the vascular anatomy. Carotid stenosis measurements (when applicable) are obtained utilizing NASCET criteria, using the distal internal  carotid diameter as the denominator.  Contrast: 50mL OMNIPAQUE IOHEXOL 350 MG/ML SOLN  Comparison:  CT of the head performed earlier on the same day.  CTA NECK  Findings:  The aorta and great vessels demonstrate normal contrast enhanced appearance.  The common carotid arteries, and bilateral internal carotid arteries are well opacified without evidence of high-grade stenosis.  No evidence of dissection.  The external carotid arteries are grossly normal.  The vertebral arteries are opacified bilaterally.  The right vertebral artery is diminutive as compared to the left.  No evidence of dissection.  Endotracheal tube is noted.  Atelectasis and / or consolidation is seen within the partially visualized lungs bilaterally.   Review of the MIP images confirms the above findings.  IMPRESSION: 1.  No CT evidence of dissection, high-grade stenosis, or aneurysm in the neck.  2. Atelectasis and /or consolidation within the partially visualized lungs, incompletely evaluated.  CTA HEAD  Findings:   Partially occlusive filling defect within the tip of the basilar artery (series [id number], image 239).  The PCA arteries are opacified distally.  No other filling defects are seen.  There is no evidence of aneurysm.  Mild irregularity of the left M1 segment is noted without high-grade stenosis.  No intracranial hemorrhage is identified.   Review of the MIP images confirms the above findings.  IMPRESSION: Question partially occlusive thrombus within the tip of the basilar artery.  Correlation with angiography is recommended.  Critical Value/emergent results were called by telephone at the time of interpretation on 11/14/2012 at 08:30 p.m. to Dr. Amada Jupiter, who verbally acknowledged these results.   Original Report Authenticated By: Rise Mu, M.D.   Ct Head Wo Contrast  11/15/2012   *RADIOLOGY REPORT*  Clinical Data: Post procedural CT.  CT HEAD WITHOUT CONTRAST  Technique:  Contiguous axial images were obtained from the base  of the skull through the vertex without contrast.  Comparison: CT 11/14/2012.  Findings: There is diffuse high attenuation of the intravascular compartment compatible with recent angiography and prior contrast administration.  Scattered tiny areas of supratentorial low attenuation are again noted which may be chronic ischemic or represent old lacunar infarcts.  There is no hemorrhage.  No mass lesion, mass effect, midline shift, hydrocephalus.  The gray-white differentiation is preserved.  No vascular coils are identified. 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  IMPRESSION: No interval change or complicating features following angiography.   Original Report Authenticated By: Andreas Newport, M.D.   Ct Head (brain) Wo Contrast  11/14/2012   *RADIOLOGY REPORT*  Clinical Data: Patient unresponsive.  CT HEAD WITHOUT CONTRAST  Technique:  Contiguous axial images were obtained from the base of the skull through the vertex without contrast.  Comparison: None.  Findings: There is some hypoattenuation in the subcortical and periventricular deep white matter.  No evidence of acute abnormality including infarction, hemorrhage, mass lesion, mass effect, midline shift or abnormal extra-axial fluid collection is identified.  There is no hydrocephalus or pneumocephalus.  The calvarium is intact.  IMPRESSION: No acute finding.  Chronic microvascular ischemic change.  Critical Value/emergent results were called by telephone at the time of interpretation on 11/14/2012 at 8:00 p.m. to Dr. Amada Jupiter, who verbally acknowledged these results.   Original Report Authenticated By: Holley Dexter, M.D.   Ct Angio Neck W/cm &/or Wo/cm  11/14/2012   *RADIOLOGY REPORT*  Clinical Data: History of atrial fibrillation, unresponsive right eye, concern for basilar thrombosis.  CT ANGIOGRAPHY HEAD AND NECK  Technique:  Multidetector CT imaging of the head and neck was performed using the standard protocol during bolus  administration of intravenous contrast.  Multiplanar CT image reconstructions including MIPs were obtained to evaluate the vascular anatomy. Carotid stenosis measurements (when applicable) are obtained utilizing NASCET criteria, using the distal internal carotid diameter as the denominator.  Contrast: 50mL OMNIPAQUE IOHEXOL 350 MG/ML SOLN  Comparison:  CT of the head performed earlier on the same day.  CTA NECK  Findings:  The aorta and great vessels demonstrate normal contrast enhanced appearance.  The common carotid arteries, and bilateral internal carotid arteries are well opacified without evidence of high-grade stenosis.  No evidence of dissection.  The external carotid arteries are grossly normal.  The vertebral arteries are opacified bilaterally.  The right vertebral artery is diminutive as compared to the left.  No evidence of dissection.  Endotracheal tube is noted.  Atelectasis and / or consolidation is seen within the partially visualized lungs bilaterally.   Review of the MIP images confirms the above findings.  IMPRESSION: 1.  No CT evidence of dissection, high-grade stenosis, or aneurysm in the neck.  2. Atelectasis and /or consolidation within the partially visualized lungs, incompletely evaluated.  CTA HEAD  Findings:   Partially occlusive filling defect within the tip of the basilar artery (series 16109, image 239).  The PCA arteries are opacified distally.  No other filling defects are seen.  There is no evidence of aneurysm.  Mild irregularity of the left M1 segment is noted without high-grade stenosis.  No intracranial hemorrhage is identified.   Review of the MIP images confirms the above findings.  IMPRESSION: Question partially occlusive thrombus within the tip of the basilar artery.  Correlation with angiography is recommended.  Critical Value/emergent results were called by telephone at the time of interpretation on 11/14/2012 at 08:30 p.m. to Dr. Amada Jupiter, who verbally acknowledged these  results.   Original Report Authenticated By: Rise Mu, M.D.   Dg Chest Port 1 View  11/15/2012   *RADIOLOGY REPORT*  Clinical Data: Intubated.  Short of breath.  PORTABLE CHEST - 1 VIEW  Comparison: None.  Findings: Endotracheal tube 51 mm from the carina.  Enteric tube is present with the tip not visualized.  Dual lead left subclavian cardiac pacemaker.  Basilar atelectasis.  There is diffuse hazy opacity in the lungs.  Suspect interstitial pulmonary edema. Cardiopericardial silhouette is mildly enlarged. Blunting of the left costophrenic angle compatible with small pleural effusion.  IMPRESSION:  1.  Support apparatus in good position. 2.  Cardiomegaly with diffuse interstitial prominence suggesting interstitial pulmonary edema. 3.  Basilar atelectasis.   Original Report Authenticated By: Andreas Newport, M.D.       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 Hx of seizures, myasthenia gravis, RLS. P:   -ASA when okay with neurology, neuro IR  -hold requip, ambien -tegretol for seizures per neurology -mestinon per neurology  PULMONARY A:  Acute respiratory failure due to inability to protect airway. P:   -pressure support wean as tolerated >> defer extubation until mental status improved  CARDIOVASCULAR A: Hx of A fib, hyperlipidemia, CAD, SSS s/p PM >> followed by [facility] cardiology as outpt P:  -cardene gtt per neurology -resume lipitor 20 -hold cardizem for now -?if should resume xarelto vs alternative agent >> may need cardiology evaluation to further assess -f/u Echo  RENAL A: Acute kidney injury >> improved. P:   -hold lotensin, lasix -continue IV fluids at 75/hr -monitor renal fx, urine outpt, electrolytes  GASTROINTESTINAL A:  Nutrition. P:   -tube feeds if unable to extubate soon -will need speech evaluation once extubated -protonix for SUP  HEMATOLOGIC A: Mild anemia, thrombocytopenia. P:  -f/u CBC -SCD  for DVT prevention  INFECTIOUS A:  No evidence for infection. P:   -monitor clinically  ENDOCRINE  A:  DM type II. P:   -SSI -hold glipizide  CC time 40 minutes.  Coralyn Helling, MD [facility] Pulmonary/Critical Care 11/15/2012, 9:18 AM Pager:  [phone] After 3pm call: [phone]

## 2012-11-15 NOTE — Progress Notes (Signed)
1 Day Post-Op  Subjective: CVA Endovascular clot retrieval in IR 7/1 Recanalization B Post Cerebral arteries; basilar artery On vent   Objective: Vital signs in last 24 hours: Temp:  [95.7 F (35.4 C)-97.6 F (36.4 C)] 97.6 F (36.4 C) (07/02 0700) Pulse Rate:  [70-105] 74 (07/02 0700) Resp:  [14-27] 17 (07/02 0700) BP: (102-203)/(54-104) 102/61 mmHg (07/02 0700) SpO2:  [94 %-100 %] 100 % (07/02 0700) FiO2 (%):  [60 %-100 %] 100 % (07/02 0345) Weight:  [232 lb 12.9 oz (105.6 kg)] 232 lb 12.9 oz (105.6 kg) (07/02 0215)    Intake/Output from previous day: 07/01 0701 - 07/02 0700 In: 1612.3 [I.V.:1612.3] Out: 900 [Urine:900] Intake/Output this shift:    PE:  Afeb; vss Vent Moves all 4s to deep stimulation Otherwise no response Rt groin sheath intact No bleeding; no hematoma Rt foot 2+ pulses Hg: 11.1 (12.9- 13.2)  Lab Results:   Recent Labs  11/14/12 2002 11/14/12 2008 11/15/12 0611  WBC 6.1  --  5.1  HGB 13.2 12.9* 11.1*  HCT 37.2* 38.0* 31.3*  PLT 148*  --  120*   BMET  Recent Labs  11/14/12 2002 11/14/12 2008 11/15/12 0611  NA 139 142 141  K 3.5 3.6 3.0*  CL 106 106 109  CO2 23  --  24  GLUCOSE 164* 163* 134*  BUN 14 14 11   CREATININE 1.37* 1.40* 1.10  CALCIUM 8.7  --  7.7*   PT/INR  Recent Labs  11/14/12 2002  LABPROT 14.9  INR 1.20   ABG  Recent Labs  11/14/12 2051  PHART 7.325*  HCO3 22.5    Studies/Results: Ct Angio Head W/cm &/or Wo Cm  11/14/2012   *RADIOLOGY REPORT*  Clinical Data: History of atrial fibrillation, unresponsive right eye, concern for basilar thrombosis.  CT ANGIOGRAPHY HEAD AND NECK  Technique:  Multidetector CT imaging of the head and neck was performed using the standard protocol during bolus administration of intravenous contrast.  Multiplanar CT image reconstructions including MIPs were obtained to evaluate the vascular anatomy. Carotid stenosis measurements (when applicable) are obtained utilizing NASCET  criteria, using the distal internal carotid diameter as the denominator.  Contrast: 50mL OMNIPAQUE IOHEXOL 350 MG/ML SOLN  Comparison:  CT of the head performed earlier on the same day.  CTA NECK  Findings:  The aorta and great vessels demonstrate normal contrast enhanced appearance.  The common carotid arteries, and bilateral internal carotid arteries are well opacified without evidence of high-grade stenosis.  No evidence of dissection.  The external carotid arteries are grossly normal.  The vertebral arteries are opacified bilaterally.  The right vertebral artery is diminutive as compared to the left.  No evidence of dissection.  Endotracheal tube is noted.  Atelectasis and / or consolidation is seen within the partially visualized lungs bilaterally.   Review of the MIP images confirms the above findings.  IMPRESSION: 1.  No CT evidence of dissection, high-grade stenosis, or aneurysm in the neck.  2. Atelectasis and /or consolidation within the partially visualized lungs, incompletely evaluated.  CTA HEAD  Findings:   Partially occlusive filling defect within the tip of the basilar artery (series [id number], image 239).  The PCA arteries are opacified distally.  No other filling defects are seen.  There is no evidence of aneurysm.  Mild irregularity of the left M1 segment is noted without high-grade stenosis.  No intracranial hemorrhage is identified.   Review of the MIP images confirms the above findings.  IMPRESSION: Question partially  occlusive thrombus within the tip of the basilar artery.  Correlation with angiography is recommended.  Critical Value/emergent results were called by telephone at the time of interpretation on 11/14/2012 at 08:30 p.m. to Dr. Amada Jupiter, who verbally acknowledged these results.   Original Report Authenticated By: Rise Mu, M.D.   Ct Head Wo Contrast  11/15/2012   *RADIOLOGY REPORT*  Clinical Data: Post procedural CT.  CT HEAD WITHOUT CONTRAST  Technique:  Contiguous  axial images were obtained from the base of the skull through the vertex without contrast.  Comparison: CT 11/14/2012.  Findings: There is diffuse high attenuation of the intravascular compartment compatible with recent angiography and prior contrast administration.  Scattered tiny areas of supratentorial low attenuation are again noted which may be chronic ischemic or represent old lacunar infarcts.  There is no hemorrhage.  No mass lesion, mass effect, midline shift, hydrocephalus.  The gray-white differentiation is preserved.  No vascular coils are identified. 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  IMPRESSION: No interval change or complicating features following angiography.   Original Report Authenticated By: Andreas Newport, M.D.   Ct Head (brain) Wo Contrast  11/14/2012   *RADIOLOGY REPORT*  Clinical Data: Patient unresponsive.  CT HEAD WITHOUT CONTRAST  Technique:  Contiguous axial images were obtained from the base of the skull through the vertex without contrast.  Comparison: None.  Findings: There is some hypoattenuation in the subcortical and periventricular deep white matter.  No evidence of acute abnormality including infarction, hemorrhage, mass lesion, mass effect, midline shift or abnormal extra-axial fluid collection is identified.  There is no hydrocephalus or pneumocephalus.  The calvarium is intact.  IMPRESSION: No acute finding.  Chronic microvascular ischemic change.  Critical Value/emergent results were called by telephone at the time of interpretation on 11/14/2012 at 8:00 p.m. to Dr. Amada Jupiter, who verbally acknowledged these results.   Original Report Authenticated By: Holley Dexter, M.D.   Ct Angio Neck W/cm &/or Wo/cm  11/14/2012   *RADIOLOGY REPORT*  Clinical Data: History of atrial fibrillation, unresponsive right eye, concern for basilar thrombosis.  CT ANGIOGRAPHY HEAD AND NECK  Technique:  Multidetector CT imaging of the head and neck was performed  using the standard protocol during bolus administration of intravenous contrast.  Multiplanar CT image reconstructions including MIPs were obtained to evaluate the vascular anatomy. Carotid stenosis measurements (when applicable) are obtained utilizing NASCET criteria, using the distal internal carotid diameter as the denominator.  Contrast: 50mL OMNIPAQUE IOHEXOL 350 MG/ML SOLN  Comparison:  CT of the head performed earlier on the same day.  CTA NECK  Findings:  The aorta and great vessels demonstrate normal contrast enhanced appearance.  The common carotid arteries, and bilateral internal carotid arteries are well opacified without evidence of high-grade stenosis.  No evidence of dissection.  The external carotid arteries are grossly normal.  The vertebral arteries are opacified bilaterally.  The right vertebral artery is diminutive as compared to the left.  No evidence of dissection.  Endotracheal tube is noted.  Atelectasis and / or consolidation is seen within the partially visualized lungs bilaterally.   Review of the MIP images confirms the above findings.  IMPRESSION: 1.  No CT evidence of dissection, high-grade stenosis, or aneurysm in the neck.  2. Atelectasis and /or consolidation within the partially visualized lungs, incompletely evaluated.  CTA HEAD  Findings:   Partially occlusive filling defect within the tip of the basilar artery (series 16109, image 239).  The PCA arteries are opacified distally.  No other filling defects are seen.  There is no evidence of aneurysm.  Mild irregularity of the left M1 segment is noted without high-grade stenosis.  No intracranial hemorrhage is identified.   Review of the MIP images confirms the above findings.  IMPRESSION: Question partially occlusive thrombus within the tip of the basilar artery.  Correlation with angiography is recommended.  Critical Value/emergent results were called by telephone at the time of interpretation on 11/14/2012 at 08:30 p.m. to Dr.  Amada Jupiter, who verbally acknowledged these results.   Original Report Authenticated By: Rise Mu, M.D.   Dg Chest Port 1 View  11/15/2012   *RADIOLOGY REPORT*  Clinical Data: Intubated.  Short of breath.  PORTABLE CHEST - 1 VIEW  Comparison: None.  Findings: Endotracheal tube 51 mm from the carina.  Enteric tube is present with the tip not visualized.  Dual lead left subclavian cardiac pacemaker.  Basilar atelectasis.  There is diffuse hazy opacity in the lungs.  Suspect interstitial pulmonary edema. Cardiopericardial silhouette is mildly enlarged. Blunting of the left costophrenic angle compatible with small pleural effusion.  IMPRESSION:  1.  Support apparatus in good position. 2.  Cardiomegaly with diffuse interstitial prominence suggesting interstitial pulmonary edema. 3.  Basilar atelectasis.   Original Report Authenticated By: Andreas Newport, M.D.    Anti-infectives: Anti-infectives   Start     Dose/Rate Route Frequency Ordered Stop   11/15/12 0000  ceFAZolin (ANCEF) IVPB 2 g/50 mL premix  Status:  Discontinued    Comments:  Give in IR during intervention   2 g 100 mL/hr over 30 Minutes Intravenous  Once 11/14/12 2350 11/15/12 0143   11/14/12 2350  ceFAZolin (ANCEF) 2-3 GM-% IVPB SOLR    Comments:  LAFFAN, EILEEN: cabinet override      11/14/12 2350 11/15/12 1159      Assessment/Plan: s/p Procedure(s): RADIOLOGY WITH ANESTHESIA (N/[name])  CVA Endovascular clot retrieval in IR 7/1 Vent now/sedated Moves all 4s to deep stimulation Will report to Dr Corliss Skains   LOS: 1 day    Frederick [name] 11/15/2012

## 2012-11-15 NOTE — Progress Notes (Signed)
VASCULAR LAB PRELIMINARY  PRELIMINARY  PRELIMINARY  PRELIMINARY  Carotid Dopplers completed.    Preliminary report:  There is 0-39% ICA stenosis.  Vertebral artery flow is antegrade.  Aleta Manternach, RVT 11/15/2012, 3:48 PM

## 2012-11-15 NOTE — Progress Notes (Signed)
UR completed 

## 2012-11-15 NOTE — Progress Notes (Signed)
Stroke Team Progress Note  HISTORY Frederick [name] is a 72 y.o. male with a history of seizure disorder, atrial fibrillation, myasthenia gravis, sick sinus syndrome status post pacemaker, diabetes who was last seen normal definitively at 12 PM 11/14/2012.The friend saw him at that time, and then when outside in the sitting in the front porch.  At 6 PM, the friend her the patient cry out and went into see him. The patient stated that something was very wrong, and was slumped over to the left. The patient stated that the friend needed to call 911 and therefore this was done.  On arrival to the ED, the patient was not reliably following commands, had a blown pupil with an hour the deviated on the right and his responses were unintelligible. 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  Given this finding, he was taken for interventional radiology to try and reestablishes posterior circulation. Given that was posterior circulation, an extended window for intervention was used.   Patient was not a TPA candidate secondary to xarelto use, delay in arrival. He was admitted to the neuro ICU post intervention for further evaluation and treatment.  SUBJECTIVE His nurse is at the bedside.    OBJECTIVE Most recent Vital Signs: Filed Vitals:   11/15/12 0900 11/15/12 0910 11/15/12 0920 11/15/12 0930  BP: 140/74 134/72 133/70 134/76  Pulse: 82 85 79 88  Temp:      TempSrc:      Resp: 18 17 17 18   Height:      Weight:      SpO2: 99% 98% 97% 97%   CBG (last 3)   Recent Labs  11/15/12 0349 11/15/12 0737  GLUCAP 130* 130*    IV Fluid Intake:   . sodium chloride 75 mL/hr at 11/15/12 0800  . niCARDipine    . propofol 10 mcg/kg/min (11/15/12 0950)    MEDICATIONS  . antiseptic oral rinse  15 mL Mouth Rinse QID  . atorvastatin  20 mg Per Tube q1800  . carbamazepine  200 mg Per Tube  Q12H  . ceFAZolin      . chlorhexidine  15 mL Mouth Rinse BID  . eptifibatide      . insulin aspart  0-20 Units Subcutaneous Q4H  . lipase/protease/amylase  2 capsule Oral TID AC  . nitroGLYCERIN      . pantoprazole sodium  40 mg Per Tube Q24H  . pyridostigmine  30 mg Oral Q4H   PRN:  acetaminophen, acetaminophen, fentaNYL, labetalol, ondansetron (ZOFRAN) IV  Diet:  NPO  Activity:  Bedrest DVT Prophylaxis:  SCDs   CLINICALLY SIGNIFICANT STUDIES Basic Metabolic Panel:   Recent Labs Lab 11/14/12 2002 11/14/12 2008 11/15/12 0611  NA 139 142 141  K 3.5 3.6 3.0*  CL 106 106 109  CO2 23  --  24  GLUCOSE 164* 163* 134*  BUN 14 14 11   CREATININE 1.37* 1.40* 1.10  CALCIUM 8.7  --  7.7*   Liver Function Tests:   Recent Labs Lab 11/14/12 2002  AST 19  ALT 14  ALKPHOS 89  BILITOT 0.9  PROT 6.3  ALBUMIN 3.4*   CBC:   Recent Labs Lab 11/14/12 2002 11/14/12 2008 11/15/12 0611  WBC 6.1  --  5.1  NEUTROABS 3.5  --  3.5  HGB 13.2 12.9* 11.1*  HCT 37.2* 38.0* 31.3*  MCV 86.1  --  86.9  PLT 148*  --  120*   Coagulation:   Recent Labs Lab 11/14/12 2002  LABPROT 14.9  INR 1.20   Cardiac Enzymes:   Recent Labs Lab 11/14/12 2003  TROPONINI <0.30   Urinalysis: No results found for this basename: COLORURINE, APPERANCEUR, LABSPEC, PHURINE, GLUCOSEU, HGBUR, BILIRUBINUR, KETONESUR, PROTEINUR, UROBILINOGEN, NITRITE, LEUKOCYTESUR,  in the last 168 hours Lipid Panel    Component Value Date/Time   CHOL 100 11/15/2012 0611   TRIG 205* 11/15/2012 0611   HDL 20* 11/15/2012 0611   CHOLHDL 5.0 11/15/2012 0611   VLDL 41* 11/15/2012 0611   LDLCALC 39 11/15/2012 0611   HgbA1C  No results found for this basename: HGBA1C    Urine Drug Screen:   No results found for this basename: labopia,  cocainscrnur,  labbenz,  amphetmu,  thcu,  labbarb    Alcohol Level: No results found for this basename: ETH,  in the last 168 hours  CT of the brain   11/15/2012    No interval change or  complicating features following angiography.    11/14/2012    No acute finding.  Chronic microvascular ischemic change.  CT Angio Head 11/14/2012   Question partially occlusive thrombus within the tip of the basilar artery.    CT Angio Neck 11/14/2012    1.  No CT evidence of dissection, high-grade stenosis, or aneurysm in the neck.  2. Atelectasis and /or consolidation within the partially visualized lungs, incompletely evaluated.      Cerebral angio S/P bilateral vert artery and bilateral common carotid aretry angiograms ,followed by partial angiographic recanalization of both PCAs and basilar artery occusion using 2 passes each with trevoprovue and Solitaire devices and 9.6 mg of Integrelin IA   MRI of the brain    MRA of the brain  See angio  2D Echocardiogram    Carotid Doppler  See angio  CXR  11/15/2012    1.  Support apparatus in good position. 2.  Cardiomegaly with diffuse interstitial prominence suggesting interstitial pulmonary edema. 3.  Basilar atelectasis.   EKG  atrial fibrillation, rate 101.   Therapy Recommendations   Physical Exam   Elderly male intubated not in distress. . Afebrile. Head is nontraumatic. Neck is supple without bruit.  . Cardiac exam no murmur or gallop. Lungs are clear to auscultation. Distal pulses are well felt. Neurological Exam :  stuporose arouses minimally to sternal rub. Eyes deviated downwards. Pupils irregular reactive. corneals preserved. Fundi not visiualized. Cough and gag weak. Mild bifacial weakness . Moves all 4 limbs semipurposefully to sternal rub UE more than LE. Moves LLE less than RLE. Tone is normal..Plantars upgoing bilaterally. ASSESSMENT Frederick [name] is a 72 y.o. male presenting with after slumping to the left - upon arrival he  was not reliably following commands, had a blown pupil within an hour ,the eyes deviated on the right and his responses were unintelligible. 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 Dr. Corliss Skains performed a partial recanalization of bilateral PCAs and basilar artery occlusion using mechanical embolectomy with solitaire device as well as IA Integrilin. Imaging confirms developing PCA infarcts; brainstem infarcts suspected but not seen on initial CT. 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  On xarelto prior to admission. Now on No anti-thrombotics for secondary stroke prevention. Patient with resultant VDRF, left hemiparesis, global aphasia, dysphagia. Work up underway.  Atrial fibrillation, on xarelto prior to admission Diabetes, HgbA1c pending, goal < 7.0 Seizure d/o Hx myasthenia gravis Hyperlipidemia, LDL 39, on lipitor 20 PTA, now on no statin (npo), goal LDL < 100 (< 70 for diabetics)   Hospital day # 1  TREATMENT/PLAN  Add aspirin 300 mg suppository daily for secondary stroke prevention.  Hold groin sheath removal until 48 hours after admission given xarelto use. Dr. Pearlean Brownie discussed with Dr. Corliss Skains  Do not extubate until scan reviewed  Systolic blood pressure goal 120-140, as per intervention  Complete stroke workup   Annie Main, MSN, RN, ANVP-BC, ANP-BC, GNP-BC [facility] Pager: [phone] 11/15/2012 9:57 AM This patient is critically ill and at significant risk of neurological worsening, death and care requires constant monitoring of vital signs, hemodynamics,respiratory and cardiac monitoring,review of multiple databases, neurological assessment, discussion with family, other specialists and medical decision making of high complexity. I spent 30 minutes of neurocritical care time  in the care of  this patient. I have personally obtained a history, examined the patient, evaluated imaging results, and formulated the assessment and plan of care. I agree with the above. Delia Heady, MD

## 2012-11-15 NOTE — Progress Notes (Signed)
PT Cancellation Note  Patient Details Name: Frederick [name] MRN: [id number] DOB: Oct 27, 1940   Cancelled Treatment:    Reason Eval/Treat Not Completed: Patient not medically ready; still intubated and on bedrest.  Will attempt tomorrow as appropriate.   Jianni Shelden,CYNDI 11/15/2012, 9:29 AM

## 2012-11-15 NOTE — Progress Notes (Signed)
SLP Cancellation Note  Patient Details Name: Frederick [name] MRN: [id number] DOB: 04/10/1941   Cancelled treatment:        Order received for Speech Evaluation.  Pt. Is currently orally intubated and unresponsive.  Please reorder once pt. Is extubated and able to participate in speech or swallow evaluations.    Maryjo Rochester T 11/15/2012, 9:15 AM

## 2012-11-15 NOTE — Anesthesia Postprocedure Evaluation (Signed)
  Anesthesia Post-op Note  Patient: Frederick [name]  Procedure(s) Performed: Procedure(s): RADIOLOGY WITH ANESTHESIA (N/A)  Patient Location: ICU  Anesthesia Type:General  Level of Consciousness: sedated and unresponsive  Airway and Oxygen Therapy: Patient remains intubated per anesthesia plan  Post-op Pain: none  Post-op Assessment: Post-op Vital signs reviewed, Patient's Cardiovascular Status Stable and Respiratory Function Stable  Post-op Vital Signs: Reviewed and stable  Complications: No apparent anesthesia complications

## 2012-11-15 NOTE — Progress Notes (Signed)
Dr. Pearlean Brownie ordered a MRI for today. However, the patient has a pacemake and the MRI was not able to be completed. Guy Franco, PA notified. CT in the AM ordered. Will continue to monitor.

## 2012-11-16 ENCOUNTER — Encounter (HOSPITAL_COMMUNITY): Payer: Self-pay | Admitting: Radiology

## 2012-11-16 ENCOUNTER — Inpatient Hospital Stay (HOSPITAL_COMMUNITY): Payer: Medicare Other

## 2012-11-16 LAB — BASIC METABOLIC PANEL
CO2: 23 mEq/L (ref 19–32)
Calcium: 7.8 mg/dL — ABNORMAL LOW (ref 8.4–10.5)
Creatinine, Ser: 1.02 mg/dL (ref 0.50–1.35)
GFR calc non Af Amer: 70 mL/min — ABNORMAL LOW (ref >90)

## 2012-11-16 LAB — GLUCOSE, CAPILLARY
Glucose-Capillary: 130 mg/dL — ABNORMAL HIGH (ref 70–99)
Glucose-Capillary: 125 mg/dL — ABNORMAL HIGH (ref 70–99)
Glucose-Capillary: 121 mg/dL — ABNORMAL HIGH (ref 70–99)
Glucose-Capillary: 125 mg/dL — ABNORMAL HIGH (ref 70–99)
Glucose-Capillary: 147 mg/dL — ABNORMAL HIGH (ref 70–99)

## 2012-11-16 MED ORDER — POTASSIUM CHLORIDE 10 MEQ/100ML IV SOLN
10.0000 meq | INTRAVENOUS | Status: AC
  Administered 2012-11-16 (×4): 10 meq via INTRAVENOUS
  Filled 2012-11-16: qty 100
  Filled 2012-11-16: qty 200
  Filled 2012-11-16: qty 100

## 2012-11-16 MED ORDER — VITAL AF 1.2 CAL PO LIQD: 1000.0000 mL | ORAL | Status: DC

## 2012-11-16 MED ORDER — PRO-STAT SUGAR FREE PO LIQD
30.0000 mL | Freq: Four times a day (QID) | ORAL | Status: DC
  Administered 2012-11-16 – 2012-11-23 (×24): 30 mL
  Filled 2012-11-16 (×31): qty 30

## 2012-11-16 MED ORDER — FENTANYL CITRATE 0.05 MG/ML IJ SOLN
25.0000 ug | INTRAMUSCULAR | Status: DC | PRN
  Administered 2012-11-16 – 2012-11-19 (×10): 50 ug via INTRAVENOUS
  Filled 2012-11-16 (×11): qty 2

## 2012-11-16 MED ORDER — MIDAZOLAM HCL 2 MG/2ML IJ SOLN
1.0000 mg | INTRAMUSCULAR | Status: DC | PRN
  Administered 2012-11-16 (×2): 1 mg via INTRAVENOUS
  Administered 2012-11-17 – 2012-11-19 (×11): 2 mg via INTRAVENOUS
  Filled 2012-11-16 (×9): qty 2
  Filled 2012-11-16: qty 4
  Filled 2012-11-16 (×3): qty 2

## 2012-11-16 MED ORDER — ACETAMINOPHEN 160 MG/5ML PO SOLN
650.0000 mg | Freq: Four times a day (QID) | ORAL | Status: DC | PRN
  Administered 2012-11-16 – 2012-11-21 (×5): 650 mg
  Filled 2012-11-16 (×5): qty 20.3

## 2012-11-16 MED ORDER — ENOXAPARIN SODIUM 40 MG/0.4ML ~~LOC~~ SOLN
40.0000 mg | SUBCUTANEOUS | Status: DC
  Administered 2012-11-16 – 2012-11-20 (×5): 40 mg via SUBCUTANEOUS
  Filled 2012-11-16 (×5): qty 0.4

## 2012-11-16 MED ORDER — SODIUM CHLORIDE 0.9 % IV BOLUS (SEPSIS)
750.0000 mL | Freq: Once | INTRAVENOUS | Status: AC
  Administered 2012-11-16: 750 mL via INTRAVENOUS

## 2012-11-16 MED ORDER — ASPIRIN 325 MG PO TABS
325.0000 mg | ORAL_TABLET | Freq: Every day | ORAL | Status: DC
  Administered 2012-11-16 – 2012-11-23 (×6): 325 mg
  Filled 2012-11-16 (×8): qty 1

## 2012-11-16 MED ORDER — PROMOTE PO LIQD
1000.0000 mL | ORAL | Status: DC
  Administered 2012-11-16 – 2012-11-21 (×6): 1000 mL
  Filled 2012-11-16 (×19): qty 1000

## 2012-11-16 MED ORDER — DILTIAZEM 12 MG/ML ORAL SUSPENSION
60.0000 mg | Freq: Four times a day (QID) | ORAL | Status: DC
  Administered 2012-11-16 – 2012-11-23 (×29): 60 mg
  Filled 2012-11-16 (×34): qty 6

## 2012-11-16 NOTE — Progress Notes (Signed)
Pt urine output decreased to less than 30cc/hr. Notified Dr. Craige Cotta; saline bolus ordered. Will continue to monitor.

## 2012-11-16 NOTE — Progress Notes (Signed)
Pt's niece Levie Heritage called for an update on the patient. Notified Guy Franco, [place]; who said she would pass this message along to Dr. Pearlean Brownie. Will continue to monitor.

## 2012-11-16 NOTE — Progress Notes (Signed)
2 Days Post-Op  Subjective: Pt sedated on vent  Objective: Vital signs in last 24 hours: Temp:  [98 F (36.7 C)-101 F (38.3 C)] 98.5 F (36.9 C) (07/03 1200) Pulse Rate:  [80-104] 90 (07/03 1200) Resp:  [6-24] 17 (07/03 1200) BP: (92-128)/(49-101) 113/49 mmHg (07/03 1200) SpO2:  [94 %-99 %] 98 % (07/03 1200) Arterial Line BP: (80-136)/(50-87) 93/61 mmHg (07/03 1100) FiO2 (%):  [40 %] 40 % (07/03 1200) Last BM Date:  (unknown- prior to admission)  Intake/Output from previous day: 07/02 0701 - 07/03 0700 In: 1851.2 [I.V.:1751.2; IV Piggyback:100] Out: 2050 [Urine:2050] Intake/Output this shift: Total I/O In: 565 [I.V.:265; IV Piggyback:300] Out: 325 [Urine:325]  Pt arousable with some purposeful movement of ext, more so on R ; pupils R- 2mm sluggish, L- 6mm nonreact, chest- sl dim BS bases; heart- irreg; abd- soft, few BS; ext- intact distal pulses, no edema; rt groin soft, no hematoma  Lab Results:   Recent Labs  11/14/12 2002 11/14/12 2008 11/15/12 0611  WBC 6.1  --  5.1  HGB 13.2 12.9* 11.1*  HCT 37.2* 38.0* 31.3*  PLT 148*  --  120*   BMET  Recent Labs  11/15/12 0611 11/16/12 0330  NA 141 143  K 3.0* 3.2*  CL 109 112  CO2 24 23  GLUCOSE 134* 138*  BUN 11 8  CREATININE 1.10 1.02  CALCIUM 7.7* 7.8*   PT/INR  Recent Labs  11/14/12 2002  LABPROT 14.9  INR 1.20   ABG  Recent Labs  11/14/12 2051  PHART 7.325*  HCO3 22.5    Studies/Results: Ct Angio Head W/cm &/or Wo Cm  11/14/2012   *RADIOLOGY REPORT*  Clinical Data: History of atrial fibrillation, unresponsive right eye, concern for basilar thrombosis.  CT ANGIOGRAPHY HEAD AND NECK  Technique:  Multidetector CT imaging of the head and neck was performed using the standard protocol during bolus administration of intravenous contrast.  Multiplanar CT image reconstructions including MIPs were obtained to evaluate the vascular anatomy. Carotid stenosis measurements (when applicable) are obtained  utilizing NASCET criteria, using the distal internal carotid diameter as the denominator.  Contrast: 50mL OMNIPAQUE IOHEXOL 350 MG/ML SOLN  Comparison:  CT of the head performed earlier on the same day.  CTA NECK  Findings:  The aorta and great vessels demonstrate normal contrast enhanced appearance.  The common carotid arteries, and bilateral internal carotid arteries are well opacified without evidence of high-grade stenosis.  No evidence of dissection.  The external carotid arteries are grossly normal.  The vertebral arteries are opacified bilaterally.  The right vertebral artery is diminutive as compared to the left.  No evidence of dissection.  Endotracheal tube is noted.  Atelectasis and / or consolidation is seen within the partially visualized lungs bilaterally.   Review of the MIP images confirms the above findings.  IMPRESSION: 1.  No CT evidence of dissection, high-grade stenosis, or aneurysm in the neck.  2. Atelectasis and /or consolidation within the partially visualized lungs, incompletely evaluated.  CTA HEAD  Findings:   Partially occlusive filling defect within the tip of the basilar artery (series [id number], image 239).  The PCA arteries are opacified distally.  No other filling defects are seen.  There is no evidence of aneurysm.  Mild irregularity of the left M1 segment is noted without high-grade stenosis.  No intracranial hemorrhage is identified.   Review of the MIP images confirms the above findings.  IMPRESSION: Question partially occlusive thrombus within the tip of the basilar  artery.  Correlation with angiography is recommended.  Critical Value/emergent results were called by telephone at the time of interpretation on 11/14/2012 at 08:30 p.m. to Dr. Amada Jupiter, who verbally acknowledged these results.   Original Report Authenticated By: Rise Mu, M.D.   Ct Head Wo Contrast  11/16/2012   *RADIOLOGY REPORT*  Clinical Data: Worsening neurological status.  Acute infarction.  CT  HEAD WITHOUT CONTRAST  Technique:  Contiguous axial images were obtained from the base of the skull through the vertex without contrast.  Comparison: 11/15/2012.  11/14/2012.  Findings: There is a low density in the cerebellum more extensive on the left than the right consistent with acute infarction do not appear any more extensive than they did yesterday.  No evidence of developing hemorrhage.  No mass effect on the fourth ventricle. Low density in the mid brain and thalami consistent with acute infarction in that area is more noticeable.  No hemorrhage.  The cerebral hemispheres continues show atrophy with chronic small vessel disease. There is possibly some low density in the left occipital region consistent with acute infarction and that portion of the left PCA territory as well.  No hydrocephalus.  No extra- axial collection.  IMPRESSION: The low densities related to posterior circulation infarctions are becoming more noticeable, but not apparently enlarging.  There is no hemorrhage or significant mass effect/shift.  The areas of involvement include the cerebellum (more extensive on the left than the right, the mid brain and thalami (more extensive on the right than the left) in the left occipital cortical and subcortical region.   Original Report Authenticated By: Paulina Fusi, M.D.   Ct Head Wo Contrast  11/15/2012   *RADIOLOGY REPORT*  Clinical Data: Right pupillary dilatation.  Recent frontal lysis of basilar artery stenosis/occlusion.  Stroke risk factors include diabetes, and atrial fibrillation.  CT HEAD WITHOUT CONTRAST  Technique:  Contiguous axial images were obtained from the base of the skull through the vertex without contrast.  Comparison: Most recent CT earlier in the day at 1:46 a.m.  Findings: There is developing cytotoxic edema in the right thalamus, dorsal midbrain, left greater than right cerebellum, and the left greater than right mid pons. These areas of cytotoxic edema were not present  earlier. There is no evidence for associated hemorrhage.  Blood pool has cleared from the intracranial vasculature. Chronic microvascular ischemic change, mild atrophy, and remote lacunar infarcts are otherwise stable. There is no hydrocephalus. There is no evidence for uncal herniation.  Calvarium intact. Moderate sinus disease.  Intubated.  IMPRESSION: Developing posterior circulation infarctions in this patient with recent basilar artery occlusion and partial recanalization.  No uncal herniation is seen to suggest a compressive third nerve lesion.  The patient's  third nerve palsy may derive from brainstem infarction.   Original Report Authenticated By: Davonna Belling, M.D.   Ct Head Wo Contrast  11/15/2012   *RADIOLOGY REPORT*  Clinical Data: Post procedural CT.  CT HEAD WITHOUT CONTRAST  Technique:  Contiguous axial images were obtained from the base of the skull through the vertex without contrast.  Comparison: CT 11/14/2012.  Findings: There is diffuse high attenuation of the intravascular compartment compatible with recent angiography and prior contrast administration.  Scattered tiny areas of supratentorial low attenuation are again noted which may be chronic ischemic or represent old lacunar infarcts.  There is no hemorrhage.  No mass lesion, mass effect, midline shift, hydrocephalus.  The gray-white differentiation is preserved.  No vascular coils are identified. Chronic paranasal sinus disease  with right maxillary antrectomy.  IMPRESSION: No interval change or complicating features following angiography.   Original Report Authenticated By: Andreas Newport, M.D.   Ct Head (brain) Wo Contrast  11/14/2012   *RADIOLOGY REPORT*  Clinical Data: Patient unresponsive.  CT HEAD WITHOUT CONTRAST  Technique:  Contiguous axial images were obtained from the base of the skull through the vertex without contrast.  Comparison: None.  Findings: There is some hypoattenuation in the subcortical and periventricular deep  white matter.  No evidence of acute abnormality including infarction, hemorrhage, mass lesion, mass effect, midline shift or abnormal extra-axial fluid collection is identified.  There is no hydrocephalus or pneumocephalus.  The calvarium is intact.  IMPRESSION: No acute finding.  Chronic microvascular ischemic change.  Critical Value/emergent results were called by telephone at the time of interpretation on 11/14/2012 at 8:00 p.m. to Dr. Amada Jupiter, who verbally acknowledged these results.   Original Report Authenticated By: Holley Dexter, M.D.   Ct Angio Neck W/cm &/or Wo/cm  11/14/2012   *RADIOLOGY REPORT*  Clinical Data: History of atrial fibrillation, unresponsive right eye, concern for basilar thrombosis.  CT ANGIOGRAPHY HEAD AND NECK  Technique:  Multidetector CT imaging of the head and neck was performed using the standard protocol during bolus administration of intravenous contrast.  Multiplanar CT image reconstructions including MIPs were obtained to evaluate the vascular anatomy. Carotid stenosis measurements (when applicable) are obtained utilizing NASCET criteria, using the distal internal carotid diameter as the denominator.  Contrast: 50mL OMNIPAQUE IOHEXOL 350 MG/ML SOLN  Comparison:  CT of the head performed earlier on the same day.  CTA NECK  Findings:  The aorta and great vessels demonstrate normal contrast enhanced appearance.  The common carotid arteries, and bilateral internal carotid arteries are well opacified without evidence of high-grade stenosis.  No evidence of dissection.  The external carotid arteries are grossly normal.  The vertebral arteries are opacified bilaterally.  The right vertebral artery is diminutive as compared to the left.  No evidence of dissection.  Endotracheal tube is noted.  Atelectasis and / or consolidation is seen within the partially visualized lungs bilaterally.   Review of the MIP images confirms the above findings.  IMPRESSION: 1.  No CT evidence of  dissection, high-grade stenosis, or aneurysm in the neck.  2. Atelectasis and /or consolidation within the partially visualized lungs, incompletely evaluated.  CTA HEAD  Findings:   Partially occlusive filling defect within the tip of the basilar artery (series [id number], image 239).  The PCA arteries are opacified distally.  No other filling defects are seen.  There is no evidence of aneurysm.  Mild irregularity of the left M1 segment is noted without high-grade stenosis.  No intracranial hemorrhage is identified.   Review of the MIP images confirms the above findings.  IMPRESSION: Question partially occlusive thrombus within the tip of the basilar artery.  Correlation with angiography is recommended.  Critical Value/emergent results were called by telephone at the time of interpretation on 11/14/2012 at 08:30 p.m. to Dr. Amada Jupiter, who verbally acknowledged these results.   Original Report Authenticated By: Rise Mu, M.D.   Dg Chest Port 1 View  11/16/2012   *RADIOLOGY REPORT*  Clinical Data: Follow-up evaluation of atelectasis.  PORTABLE CHEST - 1 VIEW  Comparison: Chest x-ray 11/15/2012.  Findings: An endotracheal tube is in place with tip 4.2 cm above the carina. A nasogastric tube is seen extending into the stomach, however, the tip of the nasogastric tube extends below the lower margin of the  image.  Left-sided pacemaker device in place with lead tips projecting over the expected location of the right atrium and right ventricular apex.  Lung volumes are slightly low. Persistent and increasing bibasilar opacities (left greater than right) with, which may reflect areas of atelectasis and/or consolidation.  Small left and trace right-sided pleural effusions. Mild pulmonary venous congestion, without frank pulmonary edema. Mild cardiomegaly is unchanged. The patient is rotated to the left on today's exam, resulting in distortion of the mediastinal contours and reduced diagnostic sensitivity and  specificity for mediastinal pathology.  Atherosclerosis in the thoracic aorta.  IMPRESSION: 1.  Support apparatus, as above. 2.  Worsening bibasilar (left greater than right) atelectasis and/or consolidation, with superimposed small left and trace right- sided pleural effusions. 3.  Mild cardiomegaly. 4.  Atherosclerosis.   Original Report Authenticated By: Trudie Reed, M.D.   Dg Chest Port 1 View  11/15/2012   *RADIOLOGY REPORT*  Clinical Data: Intubated.  Short of breath.  PORTABLE CHEST - 1 VIEW  Comparison: None.  Findings: Endotracheal tube 51 mm from the carina.  Enteric tube is present with the tip not visualized.  Dual lead left subclavian cardiac pacemaker.  Basilar atelectasis.  There is diffuse hazy opacity in the lungs.  Suspect interstitial pulmonary edema. Cardiopericardial silhouette is mildly enlarged. Blunting of the left costophrenic angle compatible with small pleural effusion.  IMPRESSION:  1.  Support apparatus in good position. 2.  Cardiomegaly with diffuse interstitial prominence suggesting interstitial pulmonary edema. 3.  Basilar atelectasis.   Original Report Authenticated By: Andreas Newport, M.D.    Anti-infectives: Anti-infectives   Start     Dose/Rate Route Frequency Ordered Stop   11/15/12 0000  ceFAZolin (ANCEF) IVPB 2 g/50 mL premix  Status:  Discontinued    Comments:  Give in IR during intervention   2 g 100 mL/hr over 30 Minutes Intravenous  Once 11/14/12 2350 11/15/12 0143   11/14/12 2350  ceFAZolin (ANCEF) 2-3 GM-% IVPB SOLR    Comments:  LAFFAN, EILEEN: cabinet override      11/14/12 2350 11/15/12 1159      Assessment/Plan: s/p posterior circulation infarctions with cerebral arteriogram /partial angiographic recanalization of both PCA's and basilar artery occlusions using IA integrelin/ trevoprovue and Solitaire devices 7/2; plans as outlined by CCM/neuro   LOS: 2 days    Kassady Laboy,D [facility] 11/16/2012

## 2012-11-16 NOTE — Significant Event (Signed)
Pt with decreased urine outpt.  Will give 750 ml saline bolus.  Coralyn Helling, MD [facility] Pulmonary/Critical Care 11/16/2012, 3:10 PM Pager:  [phone] After 3pm call: [phone]

## 2012-11-16 NOTE — Progress Notes (Signed)
OT Cancellation Note  Patient Details Name: Frederick [name] MRN: [id number] DOB: 1940/08/30   Cancelled Treatment:    Reason Eval/Treat Not Completed: Patient not medically ready. Will hold OT eval until pt appropriate to participate in therapy.  11/16/2012 Cipriano Mile OTR/L Pager [phone] Office [phone]

## 2012-11-16 NOTE — Progress Notes (Signed)
INITIAL NUTRITION ASSESSMENT  DOCUMENTATION CODES Per approved criteria  -Obesity Unspecified   INTERVENTION:  Initiate TF via OGT with Promote at 20 ml/h, increase by 10 ml every 4 hours to goal rate of 70 ml/h with Prostat 30 ml QID to provide 2080 kcals (24.9 kcals/kg ideal weight), 165 gm protein, 1410 ml free water daily.  NUTRITION DIAGNOSIS: Inadequate oral intake related to inability to eat as evidenced by NPO status.   Goal: Enteral nutrition to provide 60-70% of estimated calorie needs (22-25 kcals/kg ideal body weight) and 100% of estimated protein needs, based on ASPEN guidelines for permissive underfeeding in critically ill obese individuals.  Monitor:  TF tolerance/adequacy, weight trend, labs, vent status.  Reason for Assessment: MD Consult for TF initiation and management.  72 y.o. 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 Noted patient may need an early trach/PEG. Plans to start TF today. Received MD Consult for TF initiation and management.  Patient is currently intubated on ventilator support.  MV: 9.6 Temp:Temp (24hrs), Avg:99.2 F (37.3 C), Min:98 F (36.7 C), Max:101 F (38.3 C)   Height: Ht Readings from Last 1 Encounters:  11/15/12 6\' 1"  (1.854 m)    Weight: Wt Readings from Last 1 Encounters:  11/15/12 232 lb 12.9 oz (105.6 kg)    Ideal Body Weight: 83.6 kg  % Ideal Body Weight: 126%  Wt Readings from Last 10 Encounters:  11/15/12 232 lb 12.9 oz (105.6 kg)  11/15/12 232 lb 12.9 oz (105.6 kg)  08/28/12 225 lb (102.059 kg)  07/05/12 217 lb (98.431 kg)  10/06/11 226 lb (102.513 kg)  05/19/11 230 lb (104.327 kg)  03/26/11 236 lb (107.049 kg)  12/28/10 234 lb (106.142 kg)  11/26/10 241 lb 1.9 oz (109.371 kg)  10/26/10 239 lb (108.41 kg)    Usual Body Weight: 225 lb  % Usual Body Weight: 103%  BMI:  Body mass index  is 30.72 kg/(m^2).  Estimated Nutritional Needs: Kcal: 2100 Protein: 165 gm Fluid: 2.1-2.3 L  Skin: no issues  Diet Order: NPO  EDUCATION NEEDS: -Education not appropriate at this time   Intake/Output Summary (Last 24 hours) at 11/16/12 1133 Last data filed at 11/16/12 1100  Gross per 24 hour  Intake 2080.7 ml  Output   1475 ml  Net  605.7 ml    Last BM: PTA   Labs:   Recent Labs Lab 11/14/12 2002 11/14/12 2008 11/15/12 0611 11/16/12 0330  NA 139 142 141 143  K 3.5 3.6 3.0* 3.2*  CL 106 106 109 112  CO2 23  --  24 23  BUN 14 14 11 8   CREATININE 1.37* 1.40* 1.10 1.02  CALCIUM 8.7  --  7.7* 7.8*  GLUCOSE 164* 163* 134* 138*    CBG (last 3)   Recent Labs  11/15/12 2348 11/16/12 0353 11/16/12 0755  GLUCAP 108* 130* 125*    Scheduled Meds: . antiseptic oral rinse  15 mL Mouth Rinse QID  . aspirin  325 mg Per Tube Daily  . atorvastatin  20 mg Per Tube q1800  . carbamazepine  200 mg Per Tube Q12H  . chlorhexidine  15 mL Mouth Rinse BID  . diltiazem  60 mg Per Tube Q6H  . enoxaparin (LOVENOX) injection  40 mg Subcutaneous Q24H  . insulin aspart  0-20 Units Subcutaneous Q4H  . lipase/protease/amylase  2 capsule Oral TID AC  . pantoprazole sodium  40 mg Per Tube Q24H  . pyridostigmine  30 mg Oral Q4H    Continuous Infusions: . sodium chloride 20 mL/hr at 11/16/12 1100  . niCARDipine      Past Medical History  Diagnosis Date  . Persistent atrial fibrillation   . Type 2 diabetes mellitus   . Seizure disorder   . Myasthenia gravis   . Restless legs syndrome (RLS)   . Sick sinus syndrome     St. Jude Identity dual chamber pacemaker (DDDR) 4/03  . Coronary atherosclerosis of native coronary artery     Nonobstructive, catheterization [facility] 2006  . Mitral regurgitation     Mild  . Pneumonia   . Pneumothorax   . Recurrent UTI     Past Surgical History  Procedure Laterality Date  . Appendectomy    . Cholecystectomy    . Bladder cancer  resection    . St. jude identity dual chamber pacemaker (dddr) 4/03      04/03 by Dr Hyacinth Meeker in [place], generator change by Fawn Kirk 10/15/10  . Revision urostomy cutaneous    . Radiology with anesthesia N/A 11/14/2012    Procedure: RADIOLOGY WITH ANESTHESIA;  Surgeon: Oneal Grout, MD;  Location: [facility] OR;  Service: Radiology;  Laterality: N/A;    Joaquin Courts, RD, LDN, CNSC Pager [phone] After Hours Pager [phone]

## 2012-11-16 NOTE — Progress Notes (Signed)
PT Cancellation Note  Patient Details Name: BOWYN MERCIER MRN: [id number] DOB: 1940-07-26   Cancelled Treatment:    Reason Eval/Treat Not Completed: Patient not medically ready; still intubated on bedrest, per RN starting to follow commands.  Will attempt tomorrow.   Thelton Graca,CYNDI 11/16/2012, 11:21 AM

## 2012-11-16 NOTE — Progress Notes (Signed)
[facility] ADULT ICU REPLACEMENT PROTOCOL FOR AM LAB REPLACEMENT ONLY  The patient does apply for the [facility] Adult ICU Electrolyte Replacment Protocol based on the criteria listed below:   1. Is GFR >/= 40 ml/min? yes  Patient's GFR today is 70 2. Is urine output >/= 0.5 ml/kg/hr for the last 6 hours? yes Patient's UOP is 0.63 ml/kg/hr 3. Is BUN < 60 mg/dL? yes  Patient's BUN today is 8 4. Abnormal electrolyte(s): K+ 3.2 5. Ordered repletion with: see orders 6. If a panic level lab has been reported, has the CCM MD in charge been notified? yes.   Physician:  Dr. Maree Krabbe, Shaunte Weissinger A 11/16/2012 5:29 AM

## 2012-11-16 NOTE — Progress Notes (Signed)
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  Remained on vent post-procedure and PCCM consulted.   SIGNIFICANT EVENTS: 7/01 vertebral/carotid angiogram >> recanalization of b/l PCA and basilar arteries 7/02 Dilated Lt pupil  STUDIES:  7/01 CT head >> no acute findings 7/01 CT angio head/neck >> partial occlusive thrombus of basilar artery 7/02 Echo >> EF 45 to 50%, mild LVH, mod LA dilation 7/02 CT head >> developing posterior circulation infarctions 7/03 CT head >> The low densities related to posterior circulation infarctions are becoming more noticeable, but not apparently enlarging.   LINES / TUBES: OETT 7/1 >>> R fem art sheath 7/1 >>> 7/02 R rad art line 7/1 >>> 7/03  ANTIBIOTICS: Ancef 7/01 >>   INTERVAL HISTORY: Tolerating pressure support.   VITAL SIGNS: Temp:  [98 F (36.7 C)-101 F (38.3 C)] 98.6 F (37 C) (07/03 0700) Pulse Rate:  [79-111] 104 (07/03 0800) Resp:  [6-24] 24 (07/03 0800) BP: (103-134)/(49-76) 128/65 mmHg (07/03 0800) SpO2:  [94 %-99 %] 98 % (07/03 0800) Arterial Line BP: (80-174)/(50-73) 103/61 mmHg (07/03 0800) FiO2 (%):  [40 %] 40 % (07/03 0800) VENTILATOR SETTINGS: Vent Mode:  [-] PRVC FiO2 (%):  [40 %] 40 % Set Rate:  [14 bmp] 14 bmp Vt Set:  [500 mL] 500 mL PEEP:  [5 cmH20] 5 cmH20 Plateau Pressure:  [13 cmH20-17 cmH20] 15 cmH20  INTAKE / OUTPUT: Intake/Output     07/02 0701 - 07/03 0700 07/03 0701 - 07/04 0700   I.V. (mL/kg) 1751.2 (16.6) 150 (1.4)   IV Piggyback 100 100   Total Intake(mL/kg) 1851.2 (17.5) 250 (2.4)   Urine (mL/kg/hr) 2050 (0.8) 225 (0.9)   Total Output 2050 225   Net -198.8 +25          PHYSICAL EXAMINATION: General:  No distress Neuro: follows commands, moves Rt side HEENT: Lt pupil dilated, non reactive Cardiovascular:  Irregular, no murmurs Lungs: no wheeze Abdomen:  Soft, nontender, bowel sounds diminished Musculoskeletal:  No edema Skin:  Intact  LABS: CBC Recent Labs     11/14/12  2002  11/14/12  2008  11/15/12  0611  WBC  6.1   --   5.1  HGB  13.2  12.9*  11.1*  HCT  37.2*  38.0*  31.3*  PLT  148*   --   120*    Coag's Recent Labs     11/14/12  2002  APTT  34  INR  1.20    BMET Recent Labs     11/14/12  2002  11/14/12  2008  11/15/12  0611  11/16/12  0330  NA  139  142  141  143  K  3.5  3.6  3.0*  3.2*  CL  106  106  109  112  CO2  23   --   24  23  BUN  14  14  11  8   CREATININE  1.37*  1.40*  1.10  1.02  GLUCOSE  164*  163*  134*  138*    Electrolytes Recent Labs     11/14/12  2002  11/15/12  0611  11/16/12  0330  CALCIUM  8.7  7.7*  7.8*    Sepsis Markers No results found for this basename: LACTICACIDVEN, PROCALCITON, O2SATVEN,  in the last 72 hours  ABG Recent Labs     11/14/12  2051  PHART  7.325*  PCO2ART  43.1  PO2ART  62.0*    Liver Enzymes Recent Labs     11/14/12  2002  AST  19  ALT  14  ALKPHOS  89  BILITOT  0.9  ALBUMIN  3.4*    Cardiac Enzymes Recent Labs     11/14/12  2003  TROPONINI  <0.30    Glucose Recent Labs     11/15/12  1151  11/15/12  1554  11/15/12  1947  11/15/12  2348  11/16/12  0353  11/16/12  0755  GLUCAP  162*  148*  125*  108*  130*  125*   Lipid Panel     Component Value Date/Time   CHOL 100 11/15/2012 0611   TRIG 205* 11/15/2012 0611   HDL 20* 11/15/2012 0611   CHOLHDL 5.0 11/15/2012 0611   VLDL 41* 11/15/2012 0611   LDLCALC 39 11/15/2012 0611     Imaging Ct Angio Head W/cm &/or Wo Cm  11/14/2012   *RADIOLOGY REPORT*  Clinical Data: History of atrial fibrillation, unresponsive right eye, concern for basilar thrombosis.  CT ANGIOGRAPHY HEAD AND NECK   Technique:  Multidetector CT imaging of the head and neck was performed using the standard protocol during bolus administration of intravenous contrast.  Multiplanar CT image reconstructions including MIPs were obtained to evaluate the vascular anatomy. Carotid stenosis measurements (when applicable) are obtained utilizing NASCET criteria, using the distal internal carotid diameter as the denominator.  Contrast: 50mL OMNIPAQUE IOHEXOL 350 MG/ML SOLN  Comparison:  CT of the head performed earlier on the same day.  CTA NECK  Findings:  The aorta and great vessels demonstrate normal contrast enhanced appearance.  The common carotid arteries, and bilateral internal carotid arteries are well opacified without evidence of high-grade stenosis.  No evidence of dissection.  The external carotid arteries are grossly normal.  The vertebral arteries are opacified bilaterally.  The right vertebral artery is diminutive as compared to the left.  No evidence of dissection.  Endotracheal tube is noted.  Atelectasis and / or consolidation is seen within the partially visualized lungs bilaterally.   Review of the MIP images confirms the above findings.  IMPRESSION: 1.  No CT evidence of dissection, high-grade stenosis, or aneurysm in the neck.  2. Atelectasis and /or consolidation within the partially visualized lungs, incompletely evaluated.  CTA HEAD  Findings:   Partially occlusive filling defect within the tip of the basilar artery (series [id number], image 239).  The PCA arteries are opacified distally.  No other filling defects are seen.  There is no evidence of aneurysm.  Mild irregularity of the left M1 segment is noted without high-grade stenosis.  No intracranial hemorrhage is identified.   Review of the MIP images confirms the above findings.  IMPRESSION: Question partially occlusive thrombus within the tip of the basilar artery.  Correlation with angiography is recommended.  Critical Value/emergent results were called by  telephone at the time of interpretation on 11/14/2012 at 08:30 p.m. to Dr. Amada Jupiter, who verbally acknowledged these results.   Original Report Authenticated By: Rise Mu, M.D.   Ct Head Wo Contrast  11/16/2012   *RADIOLOGY REPORT*  Clinical Data: Worsening neurological status.  Acute infarction.  CT HEAD WITHOUT CONTRAST  Technique:  Contiguous axial images were obtained  from the base of the skull through the vertex without contrast.  Comparison: 11/15/2012.  11/14/2012.  Findings: There is a low density in the cerebellum more extensive on the left than the right consistent with acute infarction do not appear any more extensive than they did yesterday.  No evidence of developing hemorrhage.  No mass effect on the fourth ventricle. Low density in the mid brain and thalami consistent with acute infarction in that area is more noticeable.  No hemorrhage.  The cerebral hemispheres continues show atrophy with chronic small vessel disease. There is possibly some low density in the left occipital region consistent with acute infarction and that portion of the left PCA territory as well.  No hydrocephalus.  No extra- axial collection.  IMPRESSION: The low densities related to posterior circulation infarctions are becoming more noticeable, but not apparently enlarging.  There is no hemorrhage or significant mass effect/shift.  The areas of involvement include the cerebellum (more extensive on the left than the right, the mid brain and thalami (more extensive on the right than the left) in the left occipital cortical and subcortical region.   Original Report Authenticated By: Paulina Fusi, M.D.   Ct Head Wo Contrast  11/15/2012   *RADIOLOGY REPORT*  Clinical Data: Right pupillary dilatation.  Recent frontal lysis of basilar artery stenosis/occlusion.  Stroke risk factors include diabetes, and atrial fibrillation.  CT HEAD WITHOUT CONTRAST  Technique:  Contiguous axial images were obtained from the base of  the skull through the vertex without contrast.  Comparison: Most recent CT earlier in the day at 1:46 a.m.  Findings: There is developing cytotoxic edema in the right thalamus, dorsal midbrain, left greater than right cerebellum, and the left greater than right mid pons. These areas of cytotoxic edema were not present earlier. There is no evidence for associated hemorrhage.  Blood pool has cleared from the intracranial vasculature. Chronic microvascular ischemic change, mild atrophy, and remote lacunar infarcts are otherwise stable. There is no hydrocephalus. There is no evidence for uncal herniation.  Calvarium intact. Moderate sinus disease.  Intubated.  IMPRESSION: Developing posterior circulation infarctions in this patient with recent basilar artery occlusion and partial recanalization.  No uncal herniation is seen to suggest a compressive third nerve lesion.  The patient's  third nerve palsy may derive from brainstem infarction.   Original Report Authenticated By: Davonna Belling, M.D.   Ct Head Wo Contrast  11/15/2012   *RADIOLOGY REPORT*  Clinical Data: Post procedural CT.  CT HEAD WITHOUT CONTRAST  Technique:  Contiguous axial images were obtained from the base of the skull through the vertex without contrast.  Comparison: CT 11/14/2012.  Findings: There is diffuse high attenuation of the intravascular compartment compatible with recent angiography and prior contrast administration.  Scattered tiny areas of supratentorial low attenuation are again noted which may be chronic ischemic or represent old lacunar infarcts.  There is no hemorrhage.  No mass lesion, mass effect, midline shift, hydrocephalus.  The gray-white differentiation is preserved.  No vascular coils are identified. 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  IMPRESSION: No interval change or complicating features following angiography.   Original Report Authenticated By: Andreas Newport, M.D.   Ct Head (brain) Wo  Contrast  11/14/2012   *RADIOLOGY REPORT*  Clinical Data: Patient unresponsive.  CT HEAD WITHOUT CONTRAST  Technique:  Contiguous axial images were obtained from the base of the skull through the vertex without contrast.  Comparison: None.  Findings: There is some hypoattenuation in the  subcortical and periventricular deep white matter.  No evidence of acute abnormality including infarction, hemorrhage, mass lesion, mass effect, midline shift or abnormal extra-axial fluid collection is identified.  There is no hydrocephalus or pneumocephalus.  The calvarium is intact.  IMPRESSION: No acute finding.  Chronic microvascular ischemic change.  Critical Value/emergent results were called by telephone at the time of interpretation on 11/14/2012 at 8:00 p.m. to Dr. Amada Jupiter, who verbally acknowledged these results.   Original Report Authenticated By: Holley Dexter, M.D.   Ct Angio Neck W/cm &/or Wo/cm  11/14/2012   *RADIOLOGY REPORT*  Clinical Data: History of atrial fibrillation, unresponsive right eye, concern for basilar thrombosis.  CT ANGIOGRAPHY HEAD AND NECK  Technique:  Multidetector CT imaging of the head and neck was performed using the standard protocol during bolus administration of intravenous contrast.  Multiplanar CT image reconstructions including MIPs were obtained to evaluate the vascular anatomy. Carotid stenosis measurements (when applicable) are obtained utilizing NASCET criteria, using the distal internal carotid diameter as the denominator.  Contrast: 50mL OMNIPAQUE IOHEXOL 350 MG/ML SOLN  Comparison:  CT of the head performed earlier on the same day.  CTA NECK  Findings:  The aorta and great vessels demonstrate normal contrast enhanced appearance.  The common carotid arteries, and bilateral internal carotid arteries are well opacified without evidence of high-grade stenosis.  No evidence of dissection.  The external carotid arteries are grossly normal.  The vertebral arteries are opacified  bilaterally.  The right vertebral artery is diminutive as compared to the left.  No evidence of dissection.  Endotracheal tube is noted.  Atelectasis and / or consolidation is seen within the partially visualized lungs bilaterally.   Review of the MIP images confirms the above findings.  IMPRESSION: 1.  No CT evidence of dissection, high-grade stenosis, or aneurysm in the neck.  2. Atelectasis and /or consolidation within the partially visualized lungs, incompletely evaluated.  CTA HEAD  Findings:   Partially occlusive filling defect within the tip of the basilar artery (series [id number], image 239).  The PCA arteries are opacified distally.  No other filling defects are seen.  There is no evidence of aneurysm.  Mild irregularity of the left M1 segment is noted without high-grade stenosis.  No intracranial hemorrhage is identified.   Review of the MIP images confirms the above findings.  IMPRESSION: Question partially occlusive thrombus within the tip of the basilar artery.  Correlation with angiography is recommended.  Critical Value/emergent results were called by telephone at the time of interpretation on 11/14/2012 at 08:30 p.m. to Dr. Amada Jupiter, who verbally acknowledged these results.   Original Report Authenticated By: Rise Mu, M.D.   Dg Chest Port 1 View  11/16/2012   *RADIOLOGY REPORT*  Clinical Data: Follow-up evaluation of atelectasis.  PORTABLE CHEST - 1 VIEW  Comparison: Chest x-ray 11/15/2012.  Findings: An endotracheal tube is in place with tip 4.2 cm above the carina. A nasogastric tube is seen extending into the stomach, however, the tip of the nasogastric tube extends below the lower margin of the image.  Left-sided pacemaker device in place with lead tips projecting over the expected location of the right atrium and right ventricular apex.  Lung volumes are slightly low. Persistent and increasing bibasilar opacities (left greater than right) with, which may reflect areas of atelectasis  and/or consolidation.  Small left and trace right-sided pleural effusions. Mild pulmonary venous congestion, without frank pulmonary edema. Mild cardiomegaly is unchanged. The patient is rotated to the left on today's  exam, resulting in distortion of the mediastinal contours and reduced diagnostic sensitivity and specificity for mediastinal pathology.  Atherosclerosis in the thoracic aorta.  IMPRESSION: 1.  Support apparatus, as above. 2.  Worsening bibasilar (left greater than right) atelectasis and/or consolidation, with superimposed small left and trace right- sided pleural effusions. 3.  Mild cardiomegaly. 4.  Atherosclerosis.   Original Report Authenticated By: Trudie Reed, M.D.   Dg Chest Port 1 View  11/15/2012   *RADIOLOGY REPORT*  Clinical Data: Intubated.  Short of breath.  PORTABLE CHEST - 1 VIEW  Comparison: None.  Findings: Endotracheal tube 51 mm from the carina.  Enteric tube is present with the tip not visualized.  Dual lead left subclavian cardiac pacemaker.  Basilar atelectasis.  There is diffuse hazy opacity in the lungs.  Suspect interstitial pulmonary edema. Cardiopericardial silhouette is mildly enlarged. Blunting of the left costophrenic angle compatible with small pleural effusion.  IMPRESSION:  1.  Support apparatus in good position. 2.  Cardiomegaly with diffuse interstitial prominence suggesting interstitial pulmonary edema. 3.  Basilar atelectasis.   Original Report Authenticated By: Andreas Newport, M.D.       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 Hx of seizures, myasthenia gravis, RLS. P:   -continue ASA -hold requip, ambien -tegretol for seizures per neurology -mestinon per neurology -change to intermittent sedation  PULMONARY A:  Acute respiratory failure due to inability to protect airway. P:   -pressure support wean as tolerated >> will need early trach if family wishes to continue aggressive  therapy  CARDIOVASCULAR A: Hx of A fib, hyperlipidemia, CAD, SSS s/p PM >> followed by [facility] cardiology as outpt P:  -continue lipitor 20 -resume cardizem -hold full anti-coagulation for now until okay with neurosurgery >> will add DVT dose lovenox for now  RENAL A: Acute kidney injury >> improved. P:   -hold lotensin, lasix for now -KVO IV fluids -monitor renal fx, urine outpt, electrolytes  GASTROINTESTINAL A:  Nutrition. P:   -tube feeds while on vent -protonix for SUP  HEMATOLOGIC A: Mild anemia, thrombocytopenia. P:  -f/u CBC  INFECTIOUS A:  No evidence for infection. P:   -monitor clinically  ENDOCRINE  A:  DM type II. P:   -SSI -hold glipizide  CC time 40 minutes.  D/w Dr. Pearlean Brownie.  Coralyn Helling, MD [facility] Pulmonary/Critical Care 11/16/2012, 9:18 AM Pager:  [phone] After 3pm call: [phone]

## 2012-11-16 NOTE — Progress Notes (Signed)
Stroke Team Progress Note  HISTORY Frederick [name] is a 72 y.o. male with a history of seizure disorder, atrial fibrillation, myasthenia gravis, sick sinus syndrome status post pacemaker, diabetes who was last seen normal definitively at 12 PM 11/14/2012.The friend saw him at that time, and then when outside in the sitting in the front porch.  At 6 PM, the friend her the patient cry out and went into see him. The patient stated that something was very wrong, and was slumped over to the left. The patient stated that the friend needed to call 911 and therefore this was done.  On arrival to the ED, the patient was not reliably following commands, had a blown pupil with an hour the deviated on the right and his responses were unintelligible. 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  Given this finding, he was taken for interventional radiology to try and reestablishes posterior circulation. Given that was posterior circulation, an extended window for intervention was used.   Patient was not a TPA candidate secondary to xarelto use, delay in arrival. He was admitted to the neuro ICU post intervention for further evaluation and treatment.  SUBJECTIVE His nurse is at the bedside.  Patient following commands, purposeful on the right No family at bedside  OBJECTIVE Most recent Vital Signs: Filed Vitals:   11/16/12 0406 11/16/12 0500 11/16/12 0600 11/16/12 0700  BP: 118/52  111/53   Pulse: 83 88 85 90  Temp:      TempSrc:      Resp: 21 20 19 20   Height:      Weight:      SpO2: 95% 94% 96% 97%   CBG (last 3)   Recent Labs  11/15/12 1947 11/15/12 2348 11/16/12 0353  GLUCAP 125* 108* 130*    IV Fluid Intake:   . sodium chloride 75 mL/hr at 11/16/12 0700  . niCARDipine    . propofol 5 mcg/kg/min (11/16/12 0700)    MEDICATIONS  . antiseptic oral rinse  15 mL Mouth Rinse  QID  . aspirin  300 mg Rectal Daily  . atorvastatin  20 mg Per Tube q1800  . carbamazepine  200 mg Per Tube Q12H  . chlorhexidine  15 mL Mouth Rinse BID  . insulin aspart  0-20 Units Subcutaneous Q4H  . lipase/protease/amylase  2 capsule Oral TID AC  . pantoprazole sodium  40 mg Per Tube Q24H  . potassium chloride  10 mEq Intravenous Q1 Hr x 4  . pyridostigmine  30 mg Oral Q4H   PRN:  acetaminophen, acetaminophen, fentaNYL, labetalol, ondansetron (ZOFRAN) IV  Diet:  NPO  Activity:  Bedrest DVT Prophylaxis:  SCDs   CLINICALLY SIGNIFICANT STUDIES Basic Metabolic Panel:   Recent Labs Lab 11/15/12 0611 11/16/12 0330  NA 141 143  K 3.0* 3.2*  CL 109 112  CO2 24 23  GLUCOSE 134* 138*  BUN 11 8  CREATININE 1.10 1.02  CALCIUM 7.7* 7.8*   Liver Function Tests:   Recent Labs Lab 11/14/12 2002  AST 19  ALT 14  ALKPHOS 89  BILITOT 0.9  PROT 6.3  ALBUMIN 3.4*   CBC:   Recent Labs Lab 11/14/12 2002 11/14/12 2008 11/15/12 0611  WBC 6.1  --  5.1  NEUTROABS 3.5  --  3.5  HGB 13.2 12.9* 11.1*  HCT 37.2* 38.0* 31.3*  MCV 86.1  --  86.9  PLT 148*  --  120*   Coagulation:   Recent Labs Lab 11/14/12 2002  LABPROT 14.9  INR 1.20   Cardiac Enzymes:   Recent Labs Lab 11/14/12 2003  TROPONINI <0.30   Urinalysis: No results found for this basename: COLORURINE, APPERANCEUR, LABSPEC, PHURINE, GLUCOSEU, HGBUR, BILIRUBINUR, KETONESUR, PROTEINUR, UROBILINOGEN, NITRITE, LEUKOCYTESUR,  in the last 168 hours Lipid Panel    Component Value Date/Time   CHOL 100 11/15/2012 0611   TRIG 205* 11/15/2012 0611   HDL 20* 11/15/2012 0611   CHOLHDL 5.0 11/15/2012 0611   VLDL 41* 11/15/2012 0611   LDLCALC 39 11/15/2012 0611   HgbA1C  Lab Results  Component Value Date   HGBA1C 6.3* 11/15/2012    Urine Drug Screen:   No results found for this basename: labopia,  cocainscrnur,  labbenz,  amphetmu,  thcu,  labbarb    Alcohol Level: No results found for this basename: ETH,  in the last 168  hours  CT of the brain   11/16/2012     11/15/2012    No interval change or complicating features following angiography.    11/14/2012    No acute finding.  Chronic microvascular ischemic change.  CT Angio Head 11/14/2012   Question partially occlusive thrombus within the tip of the basilar artery.    CT Angio Neck 11/14/2012    1.  No CT evidence of dissection, high-grade stenosis, or aneurysm in the neck.  2. Atelectasis and /or consolidation within the partially visualized lungs, incompletely evaluated.      Cerebral angio S/P bilateral vert artery and bilateral common carotid aretry angiograms ,followed by partial angiographic recanalization of both PCAs and basilar artery occusion using 2 passes each with trevoprovue and Solitaire devices and 9.6 mg of Integrelin IA   MRI of the brain  CANNOT HAVE-HAS PPM NOT MRI COMPATIBLE  MRA of the brain  See angio  2D Echocardiogram  Ef 45-50% Diffuse HK  Carotid Doppler  See angio  CXR  11/15/2012    1.  Support apparatus in good position. 2.  Cardiomegaly with diffuse interstitial prominence suggesting interstitial pulmonary edema. 3.  Basilar atelectasis.   EKG  atrial fibrillation, rate 101.   Therapy Recommendations   Physical Exam   Elderly male intubated not in distress. . Afebrile. Head is nontraumatic. Neck is supple without bruit.  . Cardiac exam no murmur or gallop. Lungs are clear to auscultation. Distal pulses are well felt. Neurological Exam :  stuporose arouses minimally to sternal rub. Eyes deviated downwards with vertical skew.. Pupils unequal with left 5 mm and right 2 mm and unreactive.. corneals preserved. Fundi not visiualized. Cough and gag weak. Mild bifacial weakness . Moves right side purposefully and localizes and interestingly shows decerebrate posturing on the left to sternal rub. Tone is normal..Plantars upgoing bilaterally.  ASSESSMENT Frederick [name] is a 72 y.o. male presenting with after slumping to the left - upon  arrival he  was not reliably following commands, had a blown pupil within an hour ,the eyes deviated on the right and his responses were unintelligible. 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 Dr. Corliss Skains performed a partial recanalization of bilateral PCAs and basilar artery occlusion using mechanical embolectomy with solitaire device as well as IA Integrilin. Imaging confirms developing PCA infarcts; brainstem infarcts seen on subsequent CT. 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  On xarelto prior to admission. Now on No anti-thrombotics for secondary stroke prevention. Patient with resultant VDRF, left hemiparesis, global aphasia, dysphagia. Work up underway.   Atrial fibrillation, on xarelto prior to admission Diabetes, HgbA1c pending, goal < 7.0 Seizure d/o Hx myasthenia gravis Hyperlipidemia, LDL 39, on lipitor 20 PTA, now on no statin (npo), goal LDL < 100 (< 70 for diabetics) Hypokalemia   Hospital day # 2  TREATMENT/PLAN  Add aspirin 300 mg suppository daily for secondary stroke prevention.  Replace potassium  Systolic blood pressure goal 120-140, as per intervention  Change SCD to lovenox  Start tube feeds  Family attempted to be called for Dr. Pearlean Brownie to talk to regarding patients status and plan of care. Family unable to be reached.   This patient is critically ill and at significant risk of neurological worsening, death and care requires constant monitoring of vital signs, hemodynamics,respiratory and cardiac monitoring,review of multiple databases, neurological assessment, discussion with family, other specialists and medical decision making of high complexity. I spent 30 minutes of neurocritical care time  in the care of  this patient.    ADDENDUM Niece has left phone number to be called on 11/17/12 at [phone], her name is  Levie Heritage, Charity fundraiser. Apparently the family has been calling her for updates and she is willing to assist. She as well as most of the family live in [place]. One son lives here and I have requested for him to come in at 8am so that he can be a part of that conversation/update regarding his father  Gwendolyn Lima. Manson Passey, [facility] MBA, MHA [facility] Pager: [phone] 11/16/2012 4:11 PM  I have personally obtained a history, examined the patient, evaluated imaging results, and formulated the assessment and plan of care. I agree with the above. Delia Heady, MD

## 2012-11-16 NOTE — Progress Notes (Signed)
Transported patient on vent to CT and returned without incident.

## 2012-11-16 NOTE — Consult Note (Signed)
WOC ostomy consult  Stoma type/location:  RLQ, urostomy Stomal assessment/size: 1 3/4" round, budded from the skin Peristomal assessment: intact Treatment options for stomal/peristomal skin: added 2" barrier ring to aid in maintenance of seal Output urine Ostomy pouching: 1pc with 2" barrier ring  Education provided: pt with stroke, on the vent.   WOC team will remain available as needed for assistance as needed with ostomy care. Samuell Knoble [place] RN,CWOCN [phone]

## 2012-11-17 ENCOUNTER — Inpatient Hospital Stay (HOSPITAL_COMMUNITY): Payer: Medicare Other

## 2012-11-17 LAB — BASIC METABOLIC PANEL
Calcium: 8.1 mg/dL — ABNORMAL LOW (ref 8.4–10.5)
Chloride: 112 mEq/L (ref 96–112)
Creatinine, Ser: 1.08 mg/dL (ref 0.50–1.35)
GFR calc Af Amer: 76 mL/min — ABNORMAL LOW (ref >90)

## 2012-11-17 LAB — GLUCOSE, CAPILLARY

## 2012-11-17 LAB — CBC
HCT: 32.8 % — ABNORMAL LOW (ref 39.0–52.0)
Platelets: 116 10*3/uL — ABNORMAL LOW (ref 150–400)
RDW: 15.6 % — ABNORMAL HIGH (ref 11.5–15.5)
WBC: 4.9 10*3/uL (ref 4.0–10.5)

## 2012-11-17 MED ORDER — POTASSIUM CHLORIDE 20 MEQ/15ML (10%) PO LIQD
40.0000 meq | ORAL | Status: AC
  Administered 2012-11-17: 40 meq
  Filled 2012-11-17 (×2): qty 30

## 2012-11-17 MED ORDER — POTASSIUM CHLORIDE 20 MEQ/15ML (10%) PO LIQD
ORAL | Status: AC
  Administered 2012-11-17: 40 meq
  Filled 2012-11-17: qty 30

## 2012-11-17 NOTE — Progress Notes (Signed)
Stroke Team Progress Note  HISTORY Frederick [name] is a 72 y.o. male with a history of seizure disorder, atrial fibrillation, myasthenia gravis, sick sinus syndrome status post pacemaker, diabetes who was last seen normal definitively at 12 PM 11/14/2012.The friend saw him at that time, and then when outside in the sitting in the front porch.  At 6 PM, the friend her the patient cry out and went into see him. The patient stated that something was very wrong, and was slumped over to the left. The patient stated that the friend needed to call 911 and therefore this was done.  On arrival to the ED, the patient was not reliably following commands, had a blown pupil with an hour the deviated on the right and his responses were unintelligible. 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  Given this finding, he was taken for interventional radiology to try and reestablishes posterior circulation. Given that was posterior circulation, an extended window for intervention was used.   Patient was not a TPA candidate secondary to xarelto use, delay in arrival. He was admitted to the neuro ICU post intervention for further evaluation and treatment.  SUBJECTIVE Patient more awake today. Follows simple commands. Still vent supported.  OBJECTIVE Most recent Vital Signs: Filed Vitals:   11/17/12 0333 11/17/12 0400 11/17/12 0500 11/17/12 0600  BP:  125/62 118/50 111/57  Pulse:  77 88 80  Temp: 99.1 F (37.3 C)     TempSrc: Oral     Resp:  19 23 18   Height:      Weight:      SpO2:  98% 99% 96%   CBG (last 3)   Recent Labs  11/16/12 2002 11/17/12 0012 11/17/12 0334  GLUCAP 147* 155* 144*    IV Fluid Intake:   . sodium chloride 20 mL/hr at 11/17/12 0600  . feeding supplement (PROMOTE) 1,000 mL (11/17/12 0600)  . niCARDipine      MEDICATIONS  . antiseptic oral rinse  15 mL Mouth  Rinse QID  . aspirin  325 mg Per Tube Daily  . atorvastatin  20 mg Per Tube q1800  . carbamazepine  200 mg Per Tube Q12H  . chlorhexidine  15 mL Mouth Rinse BID  . diltiazem  60 mg Per Tube Q6H  . enoxaparin (LOVENOX) injection  40 mg Subcutaneous Q24H  . feeding supplement  30 mL Per Tube QID  . insulin aspart  0-20 Units Subcutaneous Q4H  . lipase/protease/amylase  2 capsule Oral TID AC  . pantoprazole sodium  40 mg Per Tube Q24H  . pyridostigmine  30 mg Oral Q4H   PRN:  acetaminophen (TYLENOL) oral liquid 160 mg/5 mL, fentaNYL, labetalol, midazolam, ondansetron (ZOFRAN) IV  Diet:  NPO  Activity:  Bedrest DVT Prophylaxis:  SCDs   CLINICALLY SIGNIFICANT STUDIES Basic Metabolic Panel:   Recent Labs Lab 11/15/12 0611 11/16/12 0330  NA 141 143  K 3.0* 3.2*  CL 109 112  CO2 24 23  GLUCOSE 134* 138*  BUN 11 8  CREATININE 1.10 1.02  CALCIUM 7.7* 7.8*   Liver Function Tests:   Recent Labs Lab 11/14/12 2002  AST 19  ALT 14  ALKPHOS 89  BILITOT 0.9  PROT 6.3  ALBUMIN 3.4*   CBC:   Recent Labs Lab 11/14/12 2002 11/14/12 2008 11/15/12 0611  WBC 6.1  --  5.1  NEUTROABS 3.5  --  3.5  HGB 13.2 12.9* 11.1*  HCT 37.2* 38.0* 31.3*  MCV 86.1  --  86.9  PLT 148*  --  120*   Coagulation:   Recent Labs Lab 11/14/12 2002  LABPROT 14.9  INR 1.20   Cardiac Enzymes:   Recent Labs Lab 11/14/12 2003  TROPONINI <0.30   Urinalysis: No results found for this basename: COLORURINE, APPERANCEUR, LABSPEC, PHURINE, GLUCOSEU, HGBUR, BILIRUBINUR, KETONESUR, PROTEINUR, UROBILINOGEN, NITRITE, LEUKOCYTESUR,  in the last 168 hours Lipid Panel    Component Value Date/Time   CHOL 100 11/15/2012 0611   TRIG 205* 11/15/2012 0611   HDL 20* 11/15/2012 0611   CHOLHDL 5.0 11/15/2012 0611   VLDL 41* 11/15/2012 0611   LDLCALC 39 11/15/2012 0611   HgbA1C  Lab Results  Component Value Date   HGBA1C 6.3* 11/15/2012    Urine Drug Screen:   No results found for this basename: labopia,   cocainscrnur,  labbenz,  amphetmu,  thcu,  labbarb    Alcohol Level: No results found for this basename: ETH,  in the last 168 hours  CT of the brain   11/16/2012     11/15/2012    No interval change or complicating features following angiography.    11/14/2012    No acute finding.  Chronic microvascular ischemic change.  CT Angio Head 11/14/2012   Question partially occlusive thrombus within the tip of the basilar artery.    CT Angio Neck 11/14/2012    1.  No CT evidence of dissection, high-grade stenosis, or aneurysm in the neck.  2. Atelectasis and /or consolidation within the partially visualized lungs, incompletely evaluated.      Cerebral angio S/P bilateral vert artery and bilateral common carotid aretry angiograms ,followed by partial angiographic recanalization of both PCAs and basilar artery occusion using 2 passes each with trevoprovue and Solitaire devices and 9.6 mg of Integrelin IA   MRI of the brain  CANNOT HAVE-HAS PPM NOT MRI COMPATIBLE  MRA of the brain  See angio  2D Echocardiogram  Ef 45-50% Diffuse HK  Carotid Doppler  See angio  CXR  11/15/2012    1.  Support apparatus in good position. 2.  Cardiomegaly with diffuse interstitial prominence suggesting interstitial pulmonary edema. 3.  Basilar atelectasis.   EKG  atrial fibrillation, rate 101.   Therapy Recommendations   Physical Exam   Elderly male intubated not in distress. . Afebrile. Head is nontraumatic. Neck is supple without bruit.  . Cardiac exam no murmur or gallop. Lungs are clear to auscultation. Distal pulses are well felt. Neurological Exam :  stuporose arouses minimally to sternal rub. Eyes deviated downwards with vertical skew.. Pupils unequal with left 5 mm and right 2 mm and unreactive.. corneals preserved. Fundi not visiualized. Cough and gag weak. Mild bifacial weakness . Moves right side purposefully and localizes and now moves left side semipurposefully to touch.. Tone is normal..Plantars upgoing  bilaterally.  ASSESSMENT Frederick [name] is a 72 y.o. male presenting with after slumping to the left - upon arrival he  was not reliably following commands, had a blown pupil within an hour ,the eyes deviated on the right and his responses were unintelligible. 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 Dr. Corliss Skains performed a partial recanalization of bilateral PCAs and basilar artery occlusion using mechanical embolectomy with solitaire device as well as IA Integrilin. Imaging confirms developing PCA infarcts; brainstem infarcts seen  on subsequent CT. Infarcts felt to be due to top of the basilar syndrome embolic secondary to atrial fibrillation.  On xarelto prior to admission. Now on No anti-thrombotics for secondary stroke prevention. Patient with resultant VDRF, left hemiparesis, global aphasia, dysphagia. Work up underway.   Atrial fibrillation, on xarelto prior to admission Diabetes, HgbA1c pending, goal < 7.0 Seizure d/o Hx myasthenia gravis Hyperlipidemia, LDL 39, on lipitor 20 PTA, now on no statin (npo), goal LDL < 100 (< 70 for diabetics) Hypokalemia   Hospital day # 3  TREATMENT/PLAN  Add aspirin 300 mg suppository daily for secondary stroke prevention.  Replace potassium  Systolic blood pressure goal 120-140, as per intervention  Tube Feeds started  Will need trach/PEG eventually  Dr. Pearlean Brownie talked to family and niece, [phone], her name is Levie Heritage, RN  Gwendolyn Lima. Manson Passey, [facility], MBA, MHA [facility] Pager: [phone] 11/17/2012 9:47 AM  This patient is critically ill and at significant risk of neurological worsening, death and care requires constant monitoring of vital signs, hemodynamics,respiratory and cardiac monitoring,review of multiple databases, neurological assessment, discussion with family, other specialists and medical decision making of high complexity.  I spent 30 minutes of neurocritical care time  in the care of  this patient.   I have personally obtained a history, examined the patient, evaluated imaging results, and formulated the assessment and plan of care. I agree with the above.  Delia Heady, MD

## 2012-11-17 NOTE — Progress Notes (Signed)
PT Cancellation Note  Patient Details Name: JESUSMANUEL ERBES MRN: [id number] DOB: 05/21/1940   Cancelled Treatment:    Reason Eval/Treat Not Completed: Patient not medically ready; remains on vent and bedrest with plan for trach.  Will attempt once stable for mobilization.   Denica Web,CYNDI 11/17/2012, 11:54 AM

## 2012-11-17 NOTE — Progress Notes (Signed)
PULMONARY  / CRITICAL CARE MEDICINE  Name: Frederick [name] MRN: [id number] DOB: Jul 20, 1940    ADMISSION DATE:  11/14/2012 CONSULTATION DATE:  11/14/2012  REFERRING MD :  Stroke PRIMARY SERVICE:  Stroke  CHIEF COMPLAINT:  Acute respiratory failure  BRIEF PATIENT DESCRIPTION:  72 yo male admitted with seizure and found to have non-occlusive basilar thrombosis, s/p neuro IR.  Remained on vent post-procedure and PCCM consulted.   SIGNIFICANT EVENTS: 7/01 vertebral/carotid angiogram >> recanalization of b/l PCA and basilar arteries 7/02 Dilated Lt pupil  STUDIES:  7/01 CT head >> no acute findings 7/01 CT angio head/neck >> partial occlusive thrombus of basilar artery 7/02 Echo >> EF 45 to 50%, mild LVH, mod LA dilation 7/02 CT head >> developing posterior circulation infarctions 7/03 CT head >> The low densities related to posterior circulation infarctions are becoming more noticeable, but not apparently enlarging.   LINES / TUBES: OETT 7/1 >>> R fem art sheath 7/1 >>> 7/02 R rad art line 7/1 >>> 7/03  ANTIBIOTICS: Ancef 7/01 >>   INTERVAL HISTORY: Tolerating pressure support.   VITAL SIGNS: Temp:  [98.3 F (36.8 C)-101.3 F (38.5 C)] 99.1 F (37.3 C) (07/04 0333) Pulse Rate:  [73-100] 78 (07/04 1000) Resp:  [16-23] 17 (07/04 1000) BP: (103-132)/(44-72) 118/59 mmHg (07/04 1000) SpO2:  [96 %-100 %] 98 % (07/04 1000) Arterial Line BP: (93)/(61) 93/61 mmHg (07/03 1100) FiO2 (%):  [39.4 %-40.5 %] 40 % (07/04 1000) VENTILATOR SETTINGS: Vent Mode:  [-] PSV;CPAP FiO2 (%):  [39.4 %-40.5 %] 40 % Set Rate:  [14 bmp] 14 bmp Vt Set:  [500 mL] 500 mL PEEP:  [5 cmH20] 5 cmH20 Pressure Support:  [5 cmH20-8 cmH20] 5 cmH20 Plateau Pressure:  [12 cmH20-17 cmH20] 14 cmH20  INTAKE / OUTPUT: Intake/Output     07/03 0701 - 07/04 0700 07/04 0701 - 07/05 0700   I.V. (mL/kg) 625 (5.9) 60 (0.6)   Other  60   NG/GT  581.2   IV Piggyback 1050    Total Intake(mL/kg) 1675 (15.9) 701.2  (6.6)   Urine (mL/kg/hr) 1050 (0.4) 275 (0.7)   Total Output 1050 275   Net +625 +426.2         PHYSICAL EXAMINATION: General:  No distress, on vent support  Neuro: follows commands, moves Rt side HEENT: Lt pupil dilated, non reactive Cardiovascular:  Irregular, no murmurs Lungs: no wheeze, clear  Abdomen:  Soft, nontender, bowel sounds diminished Musculoskeletal:  No edema Skin:  Intact  LABS: CBC Recent Labs     11/14/12  2002  11/14/12  2008  11/15/12  0611  WBC  6.1   --   5.1  HGB  13.2  12.9*  11.1*  HCT  37.2*  38.0*  31.3*  PLT  148*   --   120*   Coag's Recent Labs     11/14/12  2002  APTT  34  INR  1.20    BMET Recent Labs     11/14/12  2002  11/14/12  2008  11/15/12  0611  11/16/12  0330  NA  139  142  141  143  K  3.5  3.6  3.0*  3.2*  CL  106  106  109  112  CO2  23   --   24  23  BUN  14  14  11  8   CREATININE  1.37*  1.40*  1.10  1.02  GLUCOSE  164*  163*  134*  138*  Electrolytes Recent Labs     11/14/12  2002  11/15/12  0611  11/16/12  0330  CALCIUM  8.7  7.7*  7.8*   ABG Recent Labs     11/14/12  2051  PHART  7.325*  PCO2ART  43.1  PO2ART  62.0*   Glucose Recent Labs     11/16/12  1222  11/16/12  1620  11/16/12  2002  11/17/12  0012  11/17/12  0334  11/17/12  0759  GLUCAP  121*  125*  147*  155*  144*  179*    Imaging CXR 7/4: RLL ATX   ASSESSMENT / PLAN: NEUROLOGIC A: 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 Hx of seizures, myasthenia gravis, RLS. P:   -continue ASA -hold requip, ambien -tegretol for seizures per neurology -mestinon per neurology - intermittent sedation  PULMONARY A:  Acute respiratory failure due to inability to protect airway.      RLL ATX P:   -pressure support wean as tolerated -family desires trach  CARDIOVASCULAR A: Hx of A fib, hyperlipidemia, CAD, SSS s/p PM >> followed by [facility] cardiology as outpt P:  -continue lipitor 20 -resume cardizem -hold full  anti-coagulation for now until okay with neurosurgery >> will add DVT dose lovenox for now  RENAL A: Acute kidney injury >>resolved     Hypokalemia P:   -hold lotensin, lasix for now -KVO IV fluids -monitor renal fx, urine outpt, electrolytes -replace K  GASTROINTESTINAL A:  Nutrition. P:   -tube feeds while on vent -will need PEG-->will request Monday  -protonix for SUP  HEMATOLOGIC A: Mild anemia, thrombocytopenia. P:  -f/u CBC  INFECTIOUS A:  No evidence for infection. P:   -monitor clinically  ENDOCRINE  A:  DM type II. P:   -SSI   D/w Dr. Pearlean Brownie.   Levy Pupa, MD, PhD 11/17/2012, 12:34 PM [facility] [phone] or if no answer [phone]

## 2012-11-17 NOTE — Progress Notes (Signed)
3 Days Post-Op  Subjective: CVA; clot retrieval with recanalization Of B PCA and basilar artery- 7/2 On vent  Objective: Vital signs in last 24 hours: Temp:  [98.3 F (36.8 C)-101.3 F (38.5 C)] 99.1 F (37.3 C) (07/04 0333) Pulse Rate:  [73-100] 74 (07/04 0810) Resp:  [16-23] 17 (07/04 0810) BP: (103-132)/(44-72) 122/55 mmHg (07/04 0810) SpO2:  [96 %-100 %] 98 % (07/04 0810) Arterial Line BP: (93)/(61) 93/61 mmHg (07/03 1100) FiO2 (%):  [39.4 %-40.5 %] 40 % (07/04 0810) Last BM Date: 11/17/12  Intake/Output from previous day: 07/03 0701 - 07/04 0700 In: 1675 [I.V.:625; IV Piggyback:1050] Out: 1050 [Urine:1050] Intake/Output this shift: Total I/O In: 60 [Other:60] Out: -   PE:  Afeb; vss On vent Cannot arouse Can get pt to respond to pain - B feet; legs No response B arms today Rt pupil smaller than left  Lab Results:   Recent Labs  11/14/12 2002 11/14/12 2008 11/15/12 0611  WBC 6.1  --  5.1  HGB 13.2 12.9* 11.1*  HCT 37.2* 38.0* 31.3*  PLT 148*  --  120*   BMET  Recent Labs  11/15/12 0611 11/16/12 0330  NA 141 143  K 3.0* 3.2*  CL 109 112  CO2 24 23  GLUCOSE 134* 138*  BUN 11 8  CREATININE 1.10 1.02  CALCIUM 7.7* 7.8*   PT/INR  Recent Labs  11/14/12 2002  LABPROT 14.9  INR 1.20   ABG  Recent Labs  11/14/12 2051  PHART 7.325*  HCO3 22.5    Studies/Results: Ct Head Wo Contrast  11/16/2012   *RADIOLOGY REPORT*  Clinical Data: Worsening neurological status.  Acute infarction.  CT HEAD WITHOUT CONTRAST  Technique:  Contiguous axial images were obtained from the base of the skull through the vertex without contrast.  Comparison: 11/15/2012.  11/14/2012.  Findings: There is a low density in the cerebellum more extensive on the left than the right consistent with acute infarction do not appear any more extensive than they did yesterday.  No evidence of developing hemorrhage.  No mass effect on the fourth ventricle. Low density in the mid brain  and thalami consistent with acute infarction in that area is more noticeable.  No hemorrhage.  The cerebral hemispheres continues show atrophy with chronic small vessel disease. There is possibly some low density in the left occipital region consistent with acute infarction and that portion of the left PCA territory as well.  No hydrocephalus.  No extra- axial collection.  IMPRESSION: The low densities related to posterior circulation infarctions are becoming more noticeable, but not apparently enlarging.  There is no hemorrhage or significant mass effect/shift.  The areas of involvement include the cerebellum (more extensive on the left than the right, the mid brain and thalami (more extensive on the right than the left) in the left occipital cortical and subcortical region.   Original Report Authenticated By: Paulina Fusi, M.D.   Ct Head Wo Contrast  11/15/2012   *RADIOLOGY REPORT*  Clinical Data: Right pupillary dilatation.  Recent frontal lysis of basilar artery stenosis/occlusion.  Stroke risk factors include diabetes, and atrial fibrillation.  CT HEAD WITHOUT CONTRAST  Technique:  Contiguous axial images were obtained from the base of the skull through the vertex without contrast.  Comparison: Most recent CT earlier in the day at 1:46 a.m.  Findings: There is developing cytotoxic edema in the right thalamus, dorsal midbrain, left greater than right cerebellum, and the left greater than right mid pons. These areas of  cytotoxic edema were not present earlier. There is no evidence for associated hemorrhage.  Blood pool has cleared from the intracranial vasculature. Chronic microvascular ischemic change, mild atrophy, and remote lacunar infarcts are otherwise stable. There is no hydrocephalus. There is no evidence for uncal herniation.  Calvarium intact. Moderate sinus disease.  Intubated.  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  No uncal herniation is seen to suggest a compressive third nerve lesion.  The patient's  third nerve palsy may derive from brainstem infarction.   Original Report Authenticated By: Davonna Belling, M.D.   Dg Chest Port 1 View  11/17/2012   *RADIOLOGY REPORT*  Clinical Data: Follow up atelectasis  PORTABLE CHEST - 1 VIEW  Comparison: Prior chest x-ray 11/16/2012  Findings: Endotracheal tube is 2.8 cm above the carina.  The imaged portion of the nasogastric tube is unchanged.  The tip lies below the field of view, presumably within the stomach.  Stable cardiomegaly.  Stable dense left basilar opacity.  The right basilar opacity appears slightly improved.  Chronic prominence of the background interstitial markings persists.  Left subclavian approach cardiac rhythm maintenance device in unchanged position. No pneumothorax.  IMPRESSION:  1.  Slightly improved right basilar opacity may reflect resolving atelectasis. 2.  Persistent dense left basilar opacity consistent with atelectasis versus infiltrate.  Difficult to exclude a small left pleural effusion. 3.  Stable and satisfactory support apparatus.   Original Report Authenticated By: Malachy Moan, M.D.   Dg Chest Port 1 View  11/16/2012   *RADIOLOGY REPORT*  Clinical Data: Follow-up evaluation of atelectasis.  PORTABLE CHEST - 1 VIEW  Comparison: Chest x-ray 11/15/2012.  Findings: An endotracheal tube is in place with tip 4.2 cm above the carina. A nasogastric tube is seen extending into the stomach, however, the tip of the nasogastric tube extends below the lower margin of the image.  Left-sided pacemaker device in place with lead tips projecting over the expected location of the right atrium and right ventricular apex.  Lung volumes are slightly low. Persistent and increasing bibasilar opacities (left greater than right) with, which may reflect areas of atelectasis and/or consolidation.  Small left and trace right-sided pleural effusions. Mild  pulmonary venous congestion, without frank pulmonary edema. Mild cardiomegaly is unchanged. The patient is rotated to the left on today's exam, resulting in distortion of the mediastinal contours and reduced diagnostic sensitivity and specificity for mediastinal pathology.  Atherosclerosis in the thoracic aorta.  IMPRESSION: 1.  Support apparatus, as above. 2.  Worsening bibasilar (left greater than right) atelectasis and/or consolidation, with superimposed small left and trace right- sided pleural effusions. 3.  Mild cardiomegaly. 4.  Atherosclerosis.   Original Report Authenticated By: Trudie Reed, M.D.    Anti-infectives: Anti-infectives   Start     Dose/Rate Route Frequency Ordered Stop   11/15/12 0000  ceFAZolin (ANCEF) IVPB 2 g/50 mL premix  Status:  Discontinued    Comments:  Give in IR during intervention   2 g 100 mL/hr over 30 Minutes Intravenous  Once 11/14/12 2350 11/15/12 0143   11/14/12 2350  ceFAZolin (ANCEF) 2-3 GM-% IVPB SOLR    Comments:  LAFFAN, EILEEN: cabinet override      11/14/12 2350 11/15/12 1159      Assessment/Plan: s/p Procedure(s): RADIOLOGY WITH ANESTHESIA (N/A)  CVA; clot retrieval 7/2 Recanalization of B PCA/Basilar artery On vent  Cannot arouse Will report to Dr Corliss Skains   LOS: 3 days  Sylvio Weatherall A 11/17/2012

## 2012-11-18 LAB — CBC
Hemoglobin: 11.9 g/dL — ABNORMAL LOW (ref 13.0–17.0)
MCH: 30 pg (ref 26.0–34.0)
MCV: 90.9 fL (ref 78.0–100.0)
RBC: 3.97 MIL/uL — ABNORMAL LOW (ref 4.22–5.81)

## 2012-11-18 LAB — GLUCOSE, CAPILLARY
Glucose-Capillary: 185 mg/dL — ABNORMAL HIGH (ref 70–99)
Glucose-Capillary: 186 mg/dL — ABNORMAL HIGH (ref 70–99)
Glucose-Capillary: 197 mg/dL — ABNORMAL HIGH (ref 70–99)
Glucose-Capillary: 217 mg/dL — ABNORMAL HIGH (ref 70–99)
Glucose-Capillary: 235 mg/dL — ABNORMAL HIGH (ref 70–99)

## 2012-11-18 LAB — COMPREHENSIVE METABOLIC PANEL
ALT: 8 U/L (ref 0–53)
CO2: 22 mEq/L (ref 19–32)
Calcium: 8.4 mg/dL (ref 8.4–10.5)
Creatinine, Ser: 1.09 mg/dL (ref 0.50–1.35)
GFR calc Af Amer: 75 mL/min — ABNORMAL LOW (ref >90)
GFR calc non Af Amer: 65 mL/min — ABNORMAL LOW (ref >90)
Glucose, Bld: 225 mg/dL — ABNORMAL HIGH (ref 70–99)
Total Bilirubin: 0.7 mg/dL (ref 0.3–1.2)

## 2012-11-18 MED ORDER — PROPOFOL 10 MG/ML IV EMUL: 5.0000 ug/kg/min | Freq: Once | INTRAVENOUS | Status: DC

## 2012-11-18 MED ORDER — MIDAZOLAM HCL 2 MG/2ML IJ SOLN
4.0000 mg | Freq: Once | INTRAMUSCULAR | Status: AC
  Administered 2012-11-19: 4 mg via INTRAVENOUS
  Filled 2012-11-18: qty 4

## 2012-11-18 MED ORDER — ETOMIDATE 2 MG/ML IV SOLN
40.0000 mg | Freq: Once | INTRAVENOUS | Status: AC
  Administered 2012-11-19: 40 mg via INTRAVENOUS
  Filled 2012-11-18 (×2): qty 20

## 2012-11-18 MED ORDER — VECURONIUM BROMIDE 10 MG IV SOLR
10.0000 mg | Freq: Once | INTRAVENOUS | Status: AC
  Administered 2012-11-19: 10 mg via INTRAVENOUS
  Filled 2012-11-18 (×2): qty 10

## 2012-11-18 MED ORDER — FENTANYL CITRATE 0.05 MG/ML IJ SOLN
200.0000 ug | Freq: Once | INTRAMUSCULAR | Status: AC
  Administered 2012-11-19: 200 ug via INTRAVENOUS
  Filled 2012-11-18: qty 4

## 2012-11-18 NOTE — Progress Notes (Signed)
PT Cancellation Note  Patient Details Name: EDVARDO HONSE MRN: [id number] DOB: [birth year]   Cancelled Treatment:    Reason Eval/Treat Not Completed: Patient not medically ready; still intubated and sedated at times though tolerating pressure support.  Will check back on Monday.   WYNN,CYNDI 11/18/2012, 3:34 PM

## 2012-11-18 NOTE — Progress Notes (Addendum)
Stroke Team Progress Note  HISTORY Frederick [name] is a 72 y.o. male with a history of seizure disorder, atrial fibrillation, myasthenia gravis, sick sinus syndrome status post pacemaker, diabetes who was last seen normal definitively at 12 PM 11/14/2012.The friend saw him at that time, and then when outside in the sitting in the front porch.  At 6 PM, the friend her the patient cry out and went into see him. The patient stated that something was very wrong, and was slumped over to the left. The patient stated that the friend needed to call 911 and therefore this was done.  On arrival to the ED, the patient was not reliably following commands, had a blown pupil with an hour the deviated on the right and his responses were unintelligible. 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  Given this finding, he was taken for interventional radiology to try and reestablishes posterior circulation. Given that was posterior circulation, an extended window for intervention was used.   Patient was not a TPA candidate secondary to xarelto use, delay in arrival. He was admitted to the neuro ICU post intervention for further evaluation and treatment.  SUBJECTIVE Patient more awake today. Follows simple commands. Still vent supported.  OBJECTIVE Most recent Vital Signs: Filed Vitals:   11/18/12 0600 11/18/12 0700 11/18/12 0739 11/18/12 0800  BP: 110/48 124/57 124/57 126/62  Pulse: 76 80 86 76  Temp:    98.7 F (37.1 C)  TempSrc:    Oral  Resp: 18 17 19 18   Height:      Weight:      SpO2: 97% 99% 100% 100%   CBG (last 3)   Recent Labs  11/17/12 2357 11/18/12 0402 11/18/12 0751  GLUCAP 197* 186* 185*    IV Fluid Intake:   . sodium chloride 20 mL/hr at 11/18/12 0700  . feeding supplement (PROMOTE) 1,000 mL (11/18/12 0800)  . niCARDipine      MEDICATIONS  . antiseptic oral rinse   15 mL Mouth Rinse QID  . aspirin  325 mg Per Tube Daily  . atorvastatin  20 mg Per Tube q1800  . carbamazepine  200 mg Per Tube Q12H  . chlorhexidine  15 mL Mouth Rinse BID  . diltiazem  60 mg Per Tube Q6H  . enoxaparin (LOVENOX) injection  40 mg Subcutaneous Q24H  . feeding supplement  30 mL Per Tube QID  . insulin aspart  0-20 Units Subcutaneous Q4H  . lipase/protease/amylase  2 capsule Oral TID AC  . pantoprazole sodium  40 mg Per Tube Q24H  . pyridostigmine  30 mg Oral Q4H   PRN:  acetaminophen (TYLENOL) oral liquid 160 mg/5 mL, fentaNYL, labetalol, midazolam, ondansetron (ZOFRAN) IV  Diet:  NPO  Activity:  Bedrest DVT Prophylaxis:  SCDs   CLINICALLY SIGNIFICANT STUDIES Basic Metabolic Panel:   Recent Labs Lab 11/17/12 1000 11/18/12 0525  NA 143 144  K 3.5 4.3  CL 112 112  CO2 23 22  GLUCOSE 216* 225*  BUN 16 22  CREATININE 1.08 1.09  CALCIUM 8.1* 8.4   Liver Function Tests:   Recent Labs Lab 11/14/12 2002 11/18/12 0525  AST 19 18  ALT 14 8  ALKPHOS 89 90  BILITOT 0.9 0.7  PROT 6.3 6.0  ALBUMIN 3.4* 2.9*   CBC:   Recent Labs Lab 11/14/12 2002  11/15/12 4098 11/17/12 1000 11/18/12 0525  WBC 6.1  --  5.1 4.9 6.0  NEUTROABS 3.5  --  3.5  --   --   HGB 13.2  < > 11.1* 11.0* 11.9*  HCT 37.2*  < > 31.3* 32.8* 36.1*  MCV 86.1  --  86.9 89.4 90.9  PLT 148*  --  120* 116* 90*  < > = values in this interval not displayed. Coagulation:   Recent Labs Lab 11/14/12 2002  LABPROT 14.9  INR 1.20   Cardiac Enzymes:   Recent Labs Lab 11/14/12 2003  TROPONINI <0.30   Urinalysis: No results found for this basename: COLORURINE, APPERANCEUR, LABSPEC, PHURINE, GLUCOSEU, HGBUR, BILIRUBINUR, KETONESUR, PROTEINUR, UROBILINOGEN, NITRITE, LEUKOCYTESUR,  in the last 168 hours Lipid Panel    Component Value Date/Time   CHOL 100 11/15/2012 0611   TRIG 205* 11/15/2012 0611   HDL 20* 11/15/2012 0611   CHOLHDL 5.0 11/15/2012 0611   VLDL 41* 11/15/2012 0611   LDLCALC 39  11/15/2012 0611   HgbA1C  Lab Results  Component Value Date   HGBA1C 6.3* 11/15/2012    Urine Drug Screen:   No results found for this basename: labopia,  cocainscrnur,  labbenz,  amphetmu,  thcu,  labbarb    Alcohol Level: No results found for this basename: ETH,  in the last 168 hours  CT of the brain   11/16/2012     11/15/2012    No interval change or complicating features following angiography.    11/14/2012    No acute finding.  Chronic microvascular ischemic change.  CT Angio Head 11/14/2012   Question partially occlusive thrombus within the tip of the basilar artery.    CT Angio Neck 11/14/2012    1.  No CT evidence of dissection, high-grade stenosis, or aneurysm in the neck.  2. Atelectasis and /or consolidation within the partially visualized lungs, incompletely evaluated.      Cerebral angio S/P bilateral vert artery and bilateral common carotid aretry angiograms ,followed by partial angiographic recanalization of both PCAs and basilar artery occusion using 2 passes each with trevoprovue and Solitaire devices and 9.6 mg of Integrelin IA   MRI of the brain  CANNOT HAVE-HAS PPM NOT MRI COMPATIBLE  MRA of the brain  See angio  2D Echocardiogram  Ef 45-50% Diffuse HK  Carotid Doppler  See angio  CXR  11/15/2012    1.  Support apparatus in good position. 2.  Cardiomegaly with diffuse interstitial prominence suggesting interstitial pulmonary edema. 3.  Basilar atelectasis.   EKG  atrial fibrillation, rate 101.   Therapy Recommendations   Physical Exam   Elderly male intubated not in distress. . Afebrile. Head is nontraumatic. Neck is supple without bruit.  . Cardiac exam no murmur or gallop. Lungs are clear to auscultation. Distal pulses are well felt. Neurological Exam :  stuporose arouses minimally to sternal rub. Eyes deviated downwards with vertical skew.. Pupils unequal with left 5 mm and right 2 mm and unreactive.. corneals preserved. Fundi not visiualized. Cough and gag weak.  Mild bifacial weakness . Moves right side purposefully and localizes and now moves left side semipurposefully to touch.. Tone is normal..Plantars upgoing bilaterally.  ASSESSMENT Frederick [name] is a 72 y.o. male presenting with after slumping to the left - upon arrival he  was not reliably following commands, had a blown pupil within an hour ,the eyes deviated on the right and his responses were unintelligible. Due to delay in arrival, on Xarelto, patient was not a TPA candidate.CT angio  showed   thrombosis of terminal basilar artery. Dr. Corliss Skains performed a partial recanalization of bilateral PCAs and basilar artery occlusion using mechanical embolectomy with solitaire device as well as IA Integrilin. Imaging confirms developing PCA infarcts; brainstem infarcts seen on subsequent CT. Infarcts felt to be due to top of the basilar syndrome embolic secondary to atrial fibrillation.  On xarelto prior to admission. Now on No anti-thrombotics for secondary stroke prevention. Patient with resultant VDRF, left hemiparesis, global aphasia, dysphagia. Work up underway.   Atrial fibrillation, on xarelto prior to admission Diabetes, HgbA1c pending, goal < 7.0 Seizure d/o Hx myasthenia gravis Hyperlipidemia, LDL 39, on lipitor 20 PTA, now on no statin (npo), goal LDL < 100 (< 70 for diabetics) Hypokalemia   Hospital day # 4  TREATMENT/PLAN  NG tube placed  ASA 325 started  Systolic blood pressure goal 120-140, as per intervention  Tube Feeds started  S/p family conversation with family done by Dr. Pearlean Brownie, who want aggressive acre at this point.  Pt will need to be scheduled for Trach and Peg next week. (Dr. Pearlean Brownie talked to family and niece, [phone], her name is Levie Heritage, RN)  Con't home AED for hx of seizures.   Pauletta Browns   11/18/2012 8:20 AM

## 2012-11-18 NOTE — Progress Notes (Signed)
4 Days Post-Op  Subjective: CVA; clot retrieval  Recanalization B PCA; Basilar artery 7/2 On vent no response  Objective: Vital signs in last 24 hours: Temp:  [98.5 F (36.9 C)-102.2 F (39 C)] 98.7 F (37.1 C) (07/05 0800) Pulse Rate:  [73-98] 76 (07/05 0800) Resp:  [16-23] 18 (07/05 0800) BP: (108-135)/(48-74) 126/62 mmHg (07/05 0800) SpO2:  [97 %-100 %] 100 % (07/05 0800) FiO2 (%):  [39.4 %-40.7 %] 39.8 % (07/05 0800) Last BM Date: 11/17/12  Intake/Output from previous day: 07/04 0701 - 07/05 0700 In: 2945 [I.V.:480; NG/GT:2105] Out: 1825 [Urine:1825] Intake/Output this shift: Total I/O In: 70 [NG/GT:70] Out: 60 [Emesis/NG output:60]  PE: Tmax: 102.2 last pm; 98.7 now On vent Only responds to deep pain- B legs None upper extr   Lab Results:   Recent Labs  11/17/12 1000 11/18/12 0525  WBC 4.9 6.0  HGB 11.0* 11.9*  HCT 32.8* 36.1*  PLT 116* 90*   BMET  Recent Labs  11/17/12 1000 11/18/12 0525  NA 143 144  K 3.5 4.3  CL 112 112  CO2 23 22  GLUCOSE 216* 225*  BUN 16 22  CREATININE 1.08 1.09  CALCIUM 8.1* 8.4   PT/INR No results found for this basename: LABPROT, INR,  in the last 72 hours ABG No results found for this basename: PHART, PCO2, PO2, HCO3,  in the last 72 hours  Studies/Results: Dg Chest Port 1 View  11/17/2012   *RADIOLOGY REPORT*  Clinical Data: Follow up atelectasis  PORTABLE CHEST - 1 VIEW  Comparison: Prior chest x-ray 11/16/2012  Findings: Endotracheal tube is 2.8 cm above the carina.  The imaged portion of the nasogastric tube is unchanged.  The tip lies below the field of view, presumably within the stomach.  Stable cardiomegaly.  Stable dense left basilar opacity.  The right basilar opacity appears slightly improved.  Chronic prominence of the background interstitial markings persists.  Left subclavian approach cardiac rhythm maintenance device in unchanged position. No pneumothorax.  IMPRESSION:  1.  Slightly improved right basilar  opacity may reflect resolving atelectasis. 2.  Persistent dense left basilar opacity consistent with atelectasis versus infiltrate.  Difficult to exclude [name] small left pleural effusion. 3.  Stable and satisfactory support apparatus.   Original Report Authenticated By: Malachy Moan, M.D.    Anti-infectives: Anti-infectives   Start     Dose/Rate Route Frequency Ordered Stop   11/15/12 0000  ceFAZolin (ANCEF) IVPB 2 g/50 mL premix  Status:  Discontinued    Comments:  Give in IR during intervention   2 g 100 mL/hr over 30 Minutes Intravenous  Once 11/14/12 2350 11/15/12 0143   11/14/12 2350  ceFAZolin (ANCEF) 2-3 GM-% IVPB SOLR    Comments:  LAFFAN, EILEEN: cabinet override      11/14/12 2350 11/15/12 1159      Assessment/Plan: s/p Procedure(s): RADIOLOGY WITH ANESTHESIA (N/[name])  CVA; clot retrieval recan B PCA/ basilar artery 7/2- Dr Corliss Skains On vent No response Slight movement of legs to deep pain Will report to Dr Corliss Skains   LOS: 4 days    Frederick [name] 11/18/2012

## 2012-11-18 NOTE — Progress Notes (Signed)
PULMONARY  / CRITICAL CARE MEDICINE  Name: Frederick [name] MRN: [id number] DOB: 1941-03-23    ADMISSION DATE:  11/14/2012 CONSULTATION DATE:  11/14/2012  REFERRING MD :  Stroke PRIMARY SERVICE:  Stroke  CHIEF COMPLAINT:  Acute respiratory failure  BRIEF PATIENT DESCRIPTION:  72 yo male admitted with seizure and found to have non-occlusive basilar thrombosis, s/p neuro IR.  Remained on vent post-procedure and PCCM consulted.   SIGNIFICANT EVENTS: 7/01 vertebral/carotid angiogram >> recanalization of b/l PCA and basilar arteries 7/02 Dilated Lt pupil  STUDIES:  7/01 CT head >> no acute findings 7/01 CT angio head/neck >> partial occlusive thrombus of basilar artery 7/02 Echo >> EF 45 to 50%, mild LVH, mod LA dilation 7/02 CT head >> developing posterior circulation infarctions 7/03 CT head >> The low densities related to posterior circulation infarctions are becoming more noticeable, but not apparently enlarging.   LINES / TUBES: OETT 7/1 >>> R fem art sheath 7/1 >>> 7/02 R rad art line 7/1 >>> 7/03  ANTIBIOTICS: Ancef 7/01 >> off  INTERVAL HISTORY: Tolerating pressure support but no plans for extubation  VITAL SIGNS: Temp:  [98.5 F (36.9 C)-102.2 F (39 C)] 98.7 F (37.1 C) (07/05 0800) Pulse Rate:  [73-98] 80 (07/05 1000) Resp:  [16-23] 17 (07/05 1000) BP: (108-135)/(48-74) 123/67 mmHg (07/05 1000) SpO2:  [97 %-100 %] 100 % (07/05 1000) FiO2 (%):  [39.4 %-40.7 %] 40 % (07/05 1000) VENTILATOR SETTINGS: Vent Mode:  [-] PSV;CPAP FiO2 (%):  [39.4 %-40.7 %] 40 % Set Rate:  [14 bmp] 14 bmp Vt Set:  [500 mL] 500 mL PEEP:  [5 cmH20] 5 cmH20 Pressure Support:  [8 cmH20] 8 cmH20 Plateau Pressure:  [14 cmH20-15 cmH20] 14 cmH20  INTAKE / OUTPUT: Intake/Output     07/04 0701 - 07/05 0700 07/05 0701 - 07/06 0700   I.V. (mL/kg) 480 (4.5)    Other 360    NG/GT 2105 210   IV Piggyback     Total Intake(mL/kg) 2945 (27.9) 210 (2)   Urine (mL/kg/hr) 1825 (0.7) 250 (0.7)   Emesis/NG output  60 (0.2)   Total Output 1825 310   Net +1120 -100        Stool Occurrence 1 x     PHYSICAL EXAMINATION: General:  No distress, on vent support  Neuro: follows commands, moves Rt side HEENT: Lt pupil dilated, non reactive Cardiovascular:  Irregular, no murmurs Lungs: no wheeze, clear  Abdomen:  Soft, nontender, bowel sounds diminished Musculoskeletal:  No edema Skin:  Intact  LABS: CBC Recent Labs     11/17/12  1000  11/18/12  0525  WBC  4.9  6.0  HGB  11.0*  11.9*  HCT  32.8*  36.1*  PLT  116*  90*   Coag's No results found for this basename: APTT, INR,  in the last 72 hours  BMET Recent Labs     11/16/12  0330  11/17/12  1000  11/18/12  0525  NA  143  143  144  K  3.2*  3.5  4.3  CL  112  112  112  CO2  23  23  22   BUN  8  16  22   CREATININE  1.02  1.08  1.09  GLUCOSE  138*  216*  225*    Electrolytes Recent Labs     11/16/12  0330  11/17/12  1000  11/18/12  0525  CALCIUM  7.8*  8.1*  8.4   ABG No results  found for this basename: PHART, PCO2ART, PO2ART,  in the last 72 hours Glucose Recent Labs     11/17/12  1200  11/17/12  1614  11/17/12  1958  11/17/12  2357  11/18/12  0402  11/18/12  0751  GLUCAP  172*  157*  199*  197*  186*  185*    Imaging CXR 7/4: RLL ATX   ASSESSMENT / PLAN: NEUROLOGIC A: 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 Hx of seizures, myasthenia gravis, RLS. P:   -continue ASA -hold requip, ambien -tegretol for seizures per neurology -mestinon per neurology - intermittent sedation  PULMONARY A:  Acute respiratory failure due to inability to protect airway.      RLL ATX P:   -pressure support wean as tolerated -not meeting criteria for extubation, family desires trach  CARDIOVASCULAR A: Hx of A fib, hyperlipidemia, CAD, SSS s/p PM >> followed by [facility] cardiology as outpt P:  -continue lipitor 20 -cardizem -hold full anti-coagulation for now until okay with neurosurgery >> will  add DVT dose lovenox 7/4  RENAL A: Acute kidney injury >>resolved     Hypokalemia P:   -hold lotensin, lasix for now -KVO IV fluids -monitor renal fx, urine outpt, electrolytes -replace K  GASTROINTESTINAL A:  Nutrition. P:   -tube feeds while on vent -will need PEG-->will request Monday  -protonix for SUP  HEMATOLOGIC A: Mild anemia, thrombocytopenia. P:  -f/u CBC  INFECTIOUS A:  No evidence for infection. P:   -monitor clinically  ENDOCRINE  A:  DM type II. P:   -SSI    Levy Pupa, MD, PhD 11/18/2012, 10:28 AM [facility] [phone] or if no answer [phone]

## 2012-11-19 ENCOUNTER — Inpatient Hospital Stay (HOSPITAL_COMMUNITY): Payer: Medicare Other

## 2012-11-19 DIAGNOSIS — Z950 Presence of cardiac pacemaker: Secondary | ICD-10-CM

## 2012-11-19 DIAGNOSIS — I2510 Atherosclerotic heart disease of native coronary artery without angina pectoris: Secondary | ICD-10-CM

## 2012-11-19 DIAGNOSIS — I498 Other specified cardiac arrhythmias: Secondary | ICD-10-CM

## 2012-11-19 LAB — GLUCOSE, CAPILLARY
Glucose-Capillary: 131 mg/dL — ABNORMAL HIGH (ref 70–99)
Glucose-Capillary: 112 mg/dL — ABNORMAL HIGH (ref 70–99)

## 2012-11-19 MED ORDER — PROPOFOL 10 MG/ML IV EMUL
INTRAVENOUS | Status: AC
  Administered 2012-11-19: 1000 mg via INTRAVENOUS
  Filled 2012-11-19: qty 100

## 2012-11-19 MED ORDER — FENTANYL CITRATE 0.05 MG/ML IJ SOLN
INTRAMUSCULAR | Status: AC
  Administered 2012-11-19: 100 ug
  Filled 2012-11-19: qty 2

## 2012-11-19 MED ORDER — PROPOFOL 10 MG/ML IV EMUL: 5.0000 ug/kg/min | INTRAVENOUS | Status: DC

## 2012-11-19 NOTE — Op Note (Signed)
NAMESAMIEL, PEEL NO.:  [id number]  MEDICAL RECORD NO.:  [id number]  LOCATION:  3104                         FACILITY:  [facility]  PHYSICIAN:  Nelda Bucks, MD DATE OF BIRTH:  August 26, 1940  DATE OF PROCEDURE: DATE OF DISCHARGE:                              OPERATIVE REPORT   Consent was obtained from the patient's son fully aware of risks and benefits of the procedure including infection, bleeding, pneumothorax, and death.  PREOPERATIVE DIAGNOSES:  Stroke, bilateral occlusive basilar thrombus, inability to take airway, inability to wean.  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  BRONCHOSCOPIST FOR THE PROCEDURE:  Leslye Peer, MD.  FIRST ASSISTANT:  Zenia Resides, ACNP.  Coagulation panel and CBC, platelet count were all acceptable.  DESCRIPTION OF PROCEDURE:  Preoperatively, patient was placed in supine position.  Chlorhexidine preparation used to sterilize the operative site.  The patient never did require propofol, Versed, fentanyl, and vecuronium although will note that there was a very delayed response in questioning the IV access and functionality.  The bronchoscopist was placed a bronchoscope through the endotracheal tube and backed up to approximately 18 cm.  After chlorhexidine preparation, I injected epinephrine plus lidocaine 1% over the 2nd and 3rd endotracheal space. A 1.2 cm vertical incision was made.  Dissection made down the tracheal planes including identification of strap muscles and dissected through the strap muscles.  An 18-gauge needle over white catheter sheath was placed into the 2nd endotracheal space approximately the white catheter sheath remained.  The needle was removed.  Wire was placed through the white catheter sheath and white catheter sheath was removed.  These were all directly visualized by the bronchoscopy without any posterior wall injury.  A 14-French  punch dilator was placed over the wire into the airway.  A progressive rhino dilator approximately 37-French over a glider was placed into the airway plated.  The glider wire remained.  A size 8 tracheostomy over 28-French dilator was placed over the glider and wire successfully into the airway and removed except the tracheostomy.  All was directly visualized by the bronchoscopist.  The bronchoscope was then repositioned his tracheostomy of the carina approximately 4 cm below without any posterior wall injury or bleeding or laceration.  The tracheostomy was then sutured in place with 4 monofilament sutures.  Postoperative chest x-ray was ordered.  Blood loss for the procedure is less than 5 mL.  This patient can follow up in our Percutaneous Tracheostomy Clinic if needed by call [phone].    Nelda Bucks, MD    DJF/MEDQ  D:  11/19/2012  T:  11/19/2012  Job:  [id number]

## 2012-11-19 NOTE — Progress Notes (Signed)
Stroke Team Progress Note  HISTORY ISSACHAR BROADY is a 72 y.o. male with a history of seizure disorder, atrial fibrillation, myasthenia gravis, sick sinus syndrome status post pacemaker, diabetes who was last seen normal definitively at 12 PM 11/14/2012.The friend saw him at that time, and then when outside in the sitting in the front porch.  At 6 PM, the friend her the patient cry out and went into see him. The patient stated that something was very wrong, and was slumped over to the left. The patient stated that the friend needed to call 911 and therefore this was done.  On arrival to the ED, the patient was not reliably following commands, had a blown pupil with an hour the deviated on the right and his responses were unintelligible. 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  Given this finding, he was taken for interventional radiology to try and reestablishes posterior circulation. Given that was posterior circulation, an extended window for intervention was used.   Patient was not a TPA candidate secondary to xarelto use, delay in arrival. He was admitted to the neuro ICU post intervention for further evaluation and treatment.  SUBJECTIVE Patient more awake today. Follows simple commands. Still vent supported.  OBJECTIVE Most recent Vital Signs: Filed Vitals:   11/19/12 0500 11/19/12 0600 11/19/12 0700 11/19/12 0750  BP: 98/71 114/67 121/55 121/55  Pulse: 77 74 77 75  Temp:      TempSrc:      Resp: 16 17 16 16   Height:      Weight:      SpO2: 97% 98% 100% 100%   CBG (last 3)   Recent Labs  11/18/12 2336 11/19/12 0350 11/19/12 0744  GLUCAP 180* 131* 112*    IV Fluid Intake:   . sodium chloride 20 mL/hr at 11/19/12 0700  . feeding supplement (PROMOTE) Stopped (11/19/12 0000)  . niCARDipine      MEDICATIONS  . antiseptic oral rinse  15 mL Mouth Rinse QID   . aspirin  325 mg Per Tube Daily  . atorvastatin  20 mg Per Tube q1800  . carbamazepine  200 mg Per Tube Q12H  . chlorhexidine  15 mL Mouth Rinse BID  . diltiazem  60 mg Per Tube Q6H  . enoxaparin (LOVENOX) injection  40 mg Subcutaneous Q24H  . feeding supplement  30 mL Per Tube QID  . fentaNYL      . fentaNYL      . fentaNYL  200 mcg Intravenous Once  . insulin aspart  0-20 Units Subcutaneous Q4H  . lipase/protease/amylase  2 capsule Oral TID AC  . midazolam  4 mg Intravenous Once  . pantoprazole sodium  40 mg Per Tube Q24H  . propofol      . pyridostigmine  30 mg Oral Q4H   PRN:  acetaminophen (TYLENOL) oral liquid 160 mg/5 mL, fentaNYL, labetalol, midazolam, ondansetron (ZOFRAN) IV  Diet:  NPO  Activity:  Bedrest DVT Prophylaxis:  SCDs   CLINICALLY SIGNIFICANT STUDIES Basic Metabolic Panel:   Recent Labs Lab 11/17/12 1000 11/18/12 0525  NA 143 144  K 3.5 4.3  CL 112 112  CO2 23 22  GLUCOSE 216* 225*  BUN 16 22  CREATININE 1.08 1.09  CALCIUM 8.1* 8.4   Liver Function Tests:   Recent Labs Lab 11/14/12 2002 11/18/12 0525  AST 19 18  ALT 14 8  ALKPHOS 89 90  BILITOT 0.9 0.7  PROT 6.3 6.0  ALBUMIN 3.4* 2.9*   CBC:   Recent Labs Lab 11/14/12 2002  11/15/12 0611 11/17/12 1000 11/18/12 0525  WBC 6.1  --  5.1 4.9 6.0  NEUTROABS 3.5  --  3.5  --   --   HGB 13.2  < > 11.1* 11.0* 11.9*  HCT 37.2*  < > 31.3* 32.8* 36.1*  MCV 86.1  --  86.9 89.4 90.9  PLT 148*  --  120* 116* 90*  < > = values in this interval not displayed. Coagulation:   Recent Labs Lab 11/14/12 2002 11/19/12 0350  LABPROT 14.9 14.4  INR 1.20 1.14   Cardiac Enzymes:   Recent Labs Lab 11/14/12 2003  TROPONINI <0.30   Urinalysis: No results found for this basename: COLORURINE, APPERANCEUR, LABSPEC, PHURINE, GLUCOSEU, HGBUR, BILIRUBINUR, KETONESUR, PROTEINUR, UROBILINOGEN, NITRITE, LEUKOCYTESUR,  in the last 168 hours Lipid Panel    Component Value Date/Time   CHOL 100  11/15/2012 0611   TRIG 205* 11/15/2012 0611   HDL 20* 11/15/2012 0611   CHOLHDL 5.0 11/15/2012 0611   VLDL 41* 11/15/2012 0611   LDLCALC 39 11/15/2012 0611   HgbA1C  Lab Results  Component Value Date   HGBA1C 6.3* 11/15/2012    Urine Drug Screen:   No results found for this basename: labopia,  cocainscrnur,  labbenz,  amphetmu,  thcu,  labbarb    Alcohol Level: No results found for this basename: ETH,  in the last 168 hours  CT of the brain   11/16/2012     11/15/2012    No interval change or complicating features following angiography.    11/14/2012    No acute finding.  Chronic microvascular ischemic change.  CT Angio Head 11/14/2012   Question partially occlusive thrombus within the tip of the basilar artery.    CT Angio Neck 11/14/2012    1.  No CT evidence of dissection, high-grade stenosis, or aneurysm in the neck.  2. Atelectasis and /or consolidation within the partially visualized lungs, incompletely evaluated.      Cerebral angio S/P bilateral vert artery and bilateral common carotid aretry angiograms ,followed by partial angiographic recanalization of both PCAs and basilar artery occusion using 2 passes each with trevoprovue and Solitaire devices and 9.6 mg of Integrelin IA   MRI of the brain  CANNOT HAVE-HAS PPM NOT MRI COMPATIBLE  MRA of the brain  See angio  2D Echocardiogram  Ef 45-50% Diffuse HK  Carotid Doppler  See angio  CXR  11/15/2012    1.  Support apparatus in good position. 2.  Cardiomegaly with diffuse interstitial prominence suggesting interstitial pulmonary edema. 3.  Basilar atelectasis.   EKG  atrial fibrillation, rate 101.   Therapy Recommendations   Physical Exam   Elderly male intubated not in distress. . Afebrile. Head is nontraumatic. Neck is supple without bruit.  . Cardiac exam no murmur or gallop. Lungs are clear to auscultation. Distal pulses are well felt. Neurological Exam :  stuporose arouses minimally to sternal rub. Eyes deviated downwards with vertical  skew.. Pupils unequal with left 5 mm and right 2 mm and unreactive.. corneals preserved. Fundi not visiualized. Cough and gag weak. Mild bifacial weakness . Moves right side purposefully and localizes and now moves left side semipurposefully to touch.. Tone is normal..Plantars upgoing bilaterally.  ASSESSMENT Mr. JAHFARI AMBERS is a 72 y.o. male presenting with after slumping to the left - upon arrival he  was not reliably following commands, had a blown pupil within an hour ,the eyes deviated on the right and his responses were unintelligible. 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 Dr. Corliss Skains performed a partial recanalization of bilateral PCAs and basilar artery occlusion using mechanical embolectomy with solitaire device as well as IA Integrilin. Imaging confirms developing PCA infarcts; brainstem infarcts seen on subsequent CT. Infarcts felt to be due to top of the basilar syndrome embolic secondary to atrial fibrillation.  On xarelto prior to admission. Now on No anti-thrombotics for secondary stroke prevention. Patient with resultant VDRF, left hemiparesis, global aphasia, dysphagia. Work up underway.   Atrial fibrillation, on xarelto prior to admission Diabetes, HgbA1c pending, goal < 7.0 Seizure d/o Hx myasthenia gravis Hyperlipidemia, LDL 39, on lipitor 20 PTA, now on no statin (npo), goal LDL < 100 (< 70 for diabetics) Hypokalemia   Hospital day # 5  TREATMENT/PLAN  NG tube placed on TF  ASA 325 started  Systolic blood pressure goal 120-140, as per intervention, can liberalize to 160 in the next day.   Tube Feeds started, held now as pt is getting ready for Trach.   S/p Trach this AM  Will need to discuss PEG with family (Dr. Pearlean Brownie talked to family and niece, [phone], her name is Levie Heritage, RN)  Con't home AED for hx of seizures.   Pauletta Browns

## 2012-11-19 NOTE — Progress Notes (Signed)
Increased fio2 and rate for tracheostomy procedure. Order to return rate to original once breathing over ventilator.

## 2012-11-19 NOTE — Procedures (Signed)
Procedure done by Dr Delton Coombes and Kreg Shropshire ACNP-BC. At first bronch was introduced through ET tube and structures of tracheal rings, carina identified for operator of tracheostomy who was Dr Tyson Alias. Light of bronch passed through trachea and skin for indentification of tracheal rings for tracheostomy puncture. After this, under bronchoscopy guidance,  ET tube was pulled back sufficiently and very carefully. The ET tube was  pulled back enough to give room for tracheostomy operator and yet at same time to to ensure a secured airway. After this was accomplished, bronchoscope was withdrawn into the ET tube. After this,  Dr Tyson Alias then performed tracheostomy under video visual provided by flexible video bronchoscopy. Followng introduction of tracheostomy,  the bronchoscope was removed from ET tube and introduced through tracheostomy. Correct position of tracheostomy was ensured, with enough room between carina and distal tracheostomy and no evidence of bleeding. The bronchoscope was then withdrawn. Respiratory therapist was then instructed to remove the ET tube.  Dr Tyson Alias then proceeded to complete the tracheostomy with stay sutures.   Multiple small blanched erythemic areas at carina and involving both Right and Left MSB, extending into RLL. Appears to be suction trauma.   No complications  Will send cytology   Levy Pupa, MD, PhD 11/19/2012, 10:07 AM [facility] [phone] or if no answer [phone]

## 2012-11-19 NOTE — Procedures (Signed)
See full dication Perc trach 8 placed Blood loss less 5 cc  Mcarthur Rossetti. Tyson Alias, MD, FACP Pgr: [phone] [facility]

## 2012-11-19 NOTE — Progress Notes (Signed)
PULMONARY  / CRITICAL CARE MEDICINE  Name: Frederick [name] MRN: [id number] DOB: 1941/01/27    ADMISSION DATE:  11/14/2012 CONSULTATION DATE:  11/14/2012  REFERRING MD :  Stroke PRIMARY SERVICE:  Stroke  CHIEF COMPLAINT:  Acute respiratory failure  BRIEF PATIENT DESCRIPTION:  72 yo male admitted with seizure and found to have non-occlusive basilar thrombosis, s/p neuro IR.  Remained on vent post-procedure and PCCM consulted.   SIGNIFICANT EVENTS: 7/01 vertebral/carotid angiogram >> recanalization of b/l PCA and basilar arteries 7/02 Dilated Lt pupil  STUDIES:  7/01 CT head >> no acute findings 7/01 CT angio head/neck >> partial occlusive thrombus of basilar artery 7/02 Echo >> EF 45 to 50%, mild LVH, mod LA dilation 7/02 CT head >> developing posterior circulation infarctions 7/03 CT head >> The low densities related to posterior circulation infarctions are becoming more noticeable, but not apparently enlarging.   LINES / TUBES: OETT 7/1 >>>7/6 Trach 7/6>>> R fem art sheath 7/1 >>> 7/02 R rad art line 7/1 >>> 7/03  ANTIBIOTICS: Ancef 7/01 >> off  INTERVAL HISTORY: Tolerating pressure support but no plans for extubation  VITAL SIGNS: Temp:  [98.1 F (36.7 C)-99.3 F (37.4 C)] 98.6 F (37 C) (07/06 0400) Pulse Rate:  [73-182] 104 (07/06 0907) Resp:  [14-25] 21 (07/06 0907) BP: (67-210)/(26-120) 86/31 mmHg (07/06 0907) SpO2:  [92 %-100 %] 97 % (07/06 0907) FiO2 (%):  [39.7 %-100 %] 100 % (07/06 0914) VENTILATOR SETTINGS: Vent Mode:  [-] PRVC FiO2 (%):  [39.7 %-100 %] 100 % Set Rate:  [14 bmp-20 bmp] 20 bmp Vt Set:  [500 mL] 500 mL PEEP:  [5 cmH20] 5 cmH20 Pressure Support:  [8 cmH20] 8 cmH20 Plateau Pressure:  [14 cmH20-15 cmH20] 15 cmH20  INTAKE / OUTPUT: Intake/Output     07/05 0701 - 07/06 0700 07/06 0701 - 07/07 0700   I.V. (mL/kg) 480 (4.5)    Other     NG/GT 1520    Total Intake(mL/kg) 2000 (18.9)    Urine (mL/kg/hr) 2150 (0.8)    Total Output 2150      Net -150           PHYSICAL EXAMINATION: General:  No distress, on vent support  Neuro: follows commands, moves Rt side HEENT: Lt pupil dilated, non reactive, now w. 8 trach  Cardiovascular:  Irregular, no murmurs Lungs: occ rhonchi Abdomen:  Soft, nontender, bowel sounds diminished Musculoskeletal:  No edema Skin:  Intact  LABS: CBC Recent Labs     11/17/12  1000  11/18/12  0525  WBC  4.9  6.0  HGB  11.0*  11.9*  HCT  32.8*  36.1*  PLT  116*  90*   Coag's Recent Labs     11/19/12  0350  APTT  34  INR  1.14    BMET Recent Labs     11/17/12  1000  11/18/12  0525  NA  143  144  K  3.5  4.3  CL  112  112  CO2  23  22  BUN  16  22  CREATININE  1.08  1.09  GLUCOSE  216*  225*    Electrolytes Recent Labs     11/17/12  1000  11/18/12  0525  CALCIUM  8.1*  8.4   ABG No results found for this basename: PHART, PCO2ART, PO2ART,  in the last 72 hours Glucose Recent Labs     11/18/12  1253  11/18/12  1607  11/18/12  1946  11/18/12  2336  11/19/12  0350  11/19/12  0744  GLUCAP  221*  217*  235*  180*  131*  112*    Imaging CXR 7/4: RLL ATX   ASSESSMENT / PLAN: NEUROLOGIC A: 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 Hx of seizures, myasthenia gravis, RLS. P:   -continue ASA -hold requip, ambien -tegretol for seizures per neurology -mestinon per neurology - intermittent sedation  PULMONARY A:  Acute respiratory failure due to inability to protect airway.      RLL ATX       S/p trach 7/6 P:   -begin PSV as tolerated and work towards ATC  CARDIOVASCULAR A: Hx of A fib, hyperlipidemia, CAD, SSS s/p PM >> followed by [facility] cardiology as outpt P:  -continue lipitor 20 -cardizem -hold full anti-coagulation for now until okay with neurosurgery   RENAL A: Acute kidney injury >>resolved P:   -hold lotensin, lasix for now -KVO IV fluids -monitor renal fx, urine outpt, electrolytes -replace K  GASTROINTESTINAL A:  Nutrition. P:    -tube feeds while on vent -will need PEG-->will request Monday  -protonix for SUP  HEMATOLOGIC A: Mild anemia, thrombocytopenia. P:  -f/u CBC  INFECTIOUS A:  No evidence for infection. P:   -monitor clinically  ENDOCRINE  A:  DM type II. P:   -SSI   Levy Pupa, MD, PhD 11/19/2012, 10:05 AM  [facility] [phone] or if no answer [phone]

## 2012-11-20 ENCOUNTER — Encounter (HOSPITAL_COMMUNITY): Payer: Self-pay | Admitting: Radiology

## 2012-11-20 DIAGNOSIS — Z930 Tracheostomy status: Secondary | ICD-10-CM

## 2012-11-20 LAB — BASIC METABOLIC PANEL
BUN: 29 mg/dL — ABNORMAL HIGH (ref 6–23)
CO2: 28 mEq/L (ref 19–32)
Calcium: 8.6 mg/dL (ref 8.4–10.5)
GFR calc non Af Amer: 79 mL/min — ABNORMAL LOW (ref >90)
Glucose, Bld: 168 mg/dL — ABNORMAL HIGH (ref 70–99)

## 2012-11-20 LAB — CBC
HCT: 33.7 % — ABNORMAL LOW (ref 39.0–52.0)
Hemoglobin: 11.3 g/dL — ABNORMAL LOW (ref 13.0–17.0)
MCH: 29.8 pg (ref 26.0–34.0)
MCHC: 33.5 g/dL (ref 30.0–36.0)
MCV: 88.9 fL (ref 78.0–100.0)
RBC: 3.79 MIL/uL — ABNORMAL LOW (ref 4.22–5.81)

## 2012-11-20 LAB — GLUCOSE, CAPILLARY
Glucose-Capillary: 221 mg/dL — ABNORMAL HIGH (ref 70–99)
Glucose-Capillary: 226 mg/dL — ABNORMAL HIGH (ref 70–99)
Glucose-Capillary: 215 mg/dL — ABNORMAL HIGH (ref 70–99)
Glucose-Capillary: 257 mg/dL — ABNORMAL HIGH (ref 70–99)

## 2012-11-20 MED ORDER — CEFAZOLIN SODIUM-DEXTROSE 2-3 GM-% IV SOLR
2.0000 g | Freq: Once | INTRAVENOUS | Status: AC
  Administered 2012-11-21: 2 g via INTRAVENOUS
  Filled 2012-11-20: qty 50

## 2012-11-20 MED ORDER — ENOXAPARIN SODIUM 40 MG/0.4ML ~~LOC~~ SOLN
40.0000 mg | SUBCUTANEOUS | Status: DC
  Administered 2012-11-21 – 2012-11-23 (×2): 40 mg via SUBCUTANEOUS
  Filled 2012-11-20 (×3): qty 0.4

## 2012-11-20 NOTE — Progress Notes (Signed)
Inpatient Diabetes Program Recommendations  AACE/ADA: New Consensus Statement on Inpatient Glycemic Control (2013)  Target Ranges:  Prepandial:   less than 140 mg/dL      Peak postprandial:   less than 180 mg/dL (1-2 hours)      Critically ill patients:  140 - 180 mg/dL   Reason for Visit: Results for ELYON, ZOLL (MRN [id number]) as of 11/20/2012 14:22  Ref. Range 11/19/2012 19:38 11/19/2012 23:01 11/20/2012 03:16 11/20/2012 07:49 11/20/2012 11:41  Glucose-Capillary Latest Range: 70-99 mg/dL 782 (H) 956 (H) 213 (H) 156 (H) 226 (H)   Consider adding Lantus 15 units daily.  Will follow.

## 2012-11-20 NOTE — H&P (Signed)
Frederick [name] is an 72 y.o. male.   Chief Complaint: CVA; recanalization of B PCAs and Basilar artery 7/2 No response Vent/ Trach 7/6 Malnutrition; long term care Scheduled for percutaneous Gastric tube in am HPI: afib; DM; Sz disorder; Myasthenia gravis; CAD; CVA- clot retrieval  Past Medical History  Diagnosis Date  . Persistent atrial fibrillation   . Type 2 diabetes mellitus   . Seizure disorder   . Myasthenia gravis   . Restless legs syndrome (RLS)   . Sick sinus syndrome     St. Jude Identity dual chamber pacemaker (DDDR) 4/03  . Coronary atherosclerosis of native coronary artery     Nonobstructive, catheterization [facility] 2006  . Mitral regurgitation     Mild  . Pneumonia   . Pneumothorax   . Recurrent UTI     Past Surgical History  Procedure Laterality Date  . Appendectomy    . Cholecystectomy    . Bladder cancer resection    . St. jude identity dual chamber pacemaker (dddr) 4/03      04/03 by Dr Hyacinth Meeker in [place], generator change by Fawn Kirk 10/15/10  . Revision urostomy cutaneous    . Radiology with anesthesia N/A 11/14/2012    Procedure: RADIOLOGY WITH ANESTHESIA;  Surgeon: Oneal Grout, MD;  Location: [facility] OR;  Service: Radiology;  Laterality: N/A;    Family History  Problem Relation Age of Onset  . Coronary artery disease Brother    Social History:  reports that he quit smoking about 42 years ago. His smoking use included Cigarettes. He has a 33 pack-year smoking history. He has never used smokeless tobacco. He reports that  drinks alcohol. He reports that he does not use illicit drugs.  Allergies:  Allergies  Allergen Reactions  . Morphine     REACTION: rash    Medications Prior to Admission  Medication Sig Dispense Refill  . atorvastatin (LIPITOR) 20 MG tablet Take 20 mg by mouth daily.      . benazepril (LOTENSIN) 5 MG tablet Take 5 mg by mouth daily.      Marland Kitchen diltiazem (CARDIZEM CD) 360 MG 24 hr capsule Take 360 mg by mouth daily.      .  fluticasone (FLONASE) 50 MCG/ACT nasal spray Place 2 sprays into the nose daily.       . furosemide (LASIX) 40 MG tablet Take 40 mg by mouth daily.       Marland Kitchen glipiZIDE (GLUCOTROL XL) 5 MG 24 hr tablet Take 5 mg by mouth 3 (three) times daily.      . insulin aspart (NOVOLOG) 100 UNIT/ML injection Inject 15 Units into the skin 3 (three) times daily before meals.       . insulin glargine (LANTUS) 100 UNIT/ML injection Inject 25 Units into the skin every evening.       . Pancrelipase, Lip-Prot-Amyl, (CREON) 24000 UNITS CPEP Take 2 capsules by mouth 3 (three) times daily before meals.       . pyridostigmine (MESTINON) 180 MG CR tablet Take 180 mg by mouth 2 (two) times daily.       . Rivaroxaban (XARELTO) 15 MG TABS tablet Take 1 tablet (15 mg total) by mouth daily.  30 tablet  6  . rOPINIRole (REQUIP) 1 MG tablet Take 1 mg by mouth at bedtime.       Marland Kitchen zolpidem (AMBIEN) 10 MG tablet Take 10 mg by mouth at bedtime.         Results for orders placed during  the hospital encounter of 11/14/12 (from the past 48 hour(s))  GLUCOSE, CAPILLARY     Status: Abnormal   Collection Time    11/18/12  4:07 PM      Result Value Range   Glucose-Capillary 217 (*) 70 - 99 mg/dL  GLUCOSE, CAPILLARY     Status: Abnormal   Collection Time    11/18/12  7:46 PM      Result Value Range   Glucose-Capillary 235 (*) 70 - 99 mg/dL  GLUCOSE, CAPILLARY     Status: Abnormal   Collection Time    11/18/12 11:36 PM      Result Value Range   Glucose-Capillary 180 (*) 70 - 99 mg/dL  APTT     Status: None   Collection Time    11/19/12  3:50 AM      Result Value Range   aPTT 34  24 - 37 seconds  PROTIME-INR     Status: None   Collection Time    11/19/12  3:50 AM      Result Value Range   Prothrombin Time 14.4  11.6 - 15.2 seconds   INR 1.14  0.00 - 1.49  GLUCOSE, CAPILLARY     Status: Abnormal   Collection Time    11/19/12  3:50 AM      Result Value Range   Glucose-Capillary 131 (*) 70 - 99 mg/dL  GLUCOSE, CAPILLARY      Status: Abnormal   Collection Time    11/19/12  7:44 AM      Result Value Range   Glucose-Capillary 112 (*) 70 - 99 mg/dL  GLUCOSE, CAPILLARY     Status: Abnormal   Collection Time    11/19/12 12:52 PM      Result Value Range   Glucose-Capillary 145 (*) 70 - 99 mg/dL  GLUCOSE, CAPILLARY     Status: Abnormal   Collection Time    11/19/12  3:57 PM      Result Value Range   Glucose-Capillary 199 (*) 70 - 99 mg/dL  GLUCOSE, CAPILLARY     Status: Abnormal   Collection Time    11/19/12  7:38 PM      Result Value Range   Glucose-Capillary 188 (*) 70 - 99 mg/dL  GLUCOSE, CAPILLARY     Status: Abnormal   Collection Time    11/19/12 11:01 PM      Result Value Range   Glucose-Capillary 221 (*) 70 - 99 mg/dL  GLUCOSE, CAPILLARY     Status: Abnormal   Collection Time    11/20/12  3:16 AM      Result Value Range   Glucose-Capillary 175 (*) 70 - 99 mg/dL  CBC     Status: Abnormal   Collection Time    11/20/12  4:20 AM      Result Value Range   WBC 5.4  4.0 - 10.5 K/uL   RBC 3.79 (*) 4.22 - 5.81 MIL/uL   Hemoglobin 11.3 (*) 13.0 - 17.0 g/dL   HCT 47.8 (*) 29.5 - 62.1 %   MCV 88.9  78.0 - 100.0 fL   MCH 29.8  26.0 - 34.0 pg   MCHC 33.5  30.0 - 36.0 g/dL   RDW 30.8  65.7 - 84.6 %   Platelets 126 (*) 150 - 400 K/uL   Comment: DELTA CHECK NOTED     REPEATED TO VERIFY  BASIC METABOLIC PANEL     Status: Abnormal   Collection Time    11/20/12  4:20  AM      Result Value Range   Sodium 143  135 - 145 mEq/L   Potassium 4.2  3.5 - 5.1 mEq/L   Chloride 108  96 - 112 mEq/L   CO2 28  19 - 32 mEq/L   Glucose, Bld 168 (*) 70 - 99 mg/dL   BUN 29 (*) 6 - 23 mg/dL   Creatinine, Ser 1.61  0.50 - 1.35 mg/dL   Calcium 8.6  8.4 - 09.6 mg/dL   GFR calc non Af Amer 79 (*) >90 mL/min   GFR calc Af Amer >90  >90 mL/min   Comment:            The eGFR has been calculated     using the CKD EPI equation.     This calculation has not been     validated in all clinical     situations.     eGFR's  persistently     <90 mL/min signify     possible Chronic Kidney Disease.  GLUCOSE, CAPILLARY     Status: Abnormal   Collection Time    11/20/12  7:49 AM      Result Value Range   Glucose-Capillary 156 (*) 70 - 99 mg/dL  GLUCOSE, CAPILLARY     Status: Abnormal   Collection Time    11/20/12 11:41 AM      Result Value Range   Glucose-Capillary 226 (*) 70 - 99 mg/dL   Chest Portable 1 View To Assess Tube Placement And Rule-out Pneumothorax  11/19/2012   *RADIOLOGY REPORT*  Clinical Data: Evaluate tube placement and rule out pneumothorax  PORTABLE CHEST - 1 VIEW  Comparison: 11/17/2012  Findings: Tracheostomy tube tip is above the carina.  There is a left chest wall pacer device with lead in the right atrial appendage and right ventricle.  Mild cardiac enlargement.  No change in the left lung base opacity.  Right lung is clear.  IMPRESSION:  1.  No change in aeration to the left lung base. 2.  Satisfactory position of the tracheostomy tube.   Original Report Authenticated By: Signa Kell, M.D.   Dg Abd Portable 1v  11/19/2012   *RADIOLOGY REPORT*  Clinical Data: The NG tube placement  PORTABLE ABDOMEN - 1 VIEW  Comparison: 09/16/2011  Findings: Nasogastric tube has been inserted in the stomach.  Tip lies along the greater curvature.  Gas pattern is nonspecific. Prior cholecystectomy.  IMPRESSION: Nasogastric tube tip lies within the stomach.   Original Report Authenticated By: Davonna Belling, M.D.    Review of Systems  Constitutional: Negative for fever.  Respiratory:       Trach    Blood pressure 118/71, pulse 96, temperature 99.5 F (37.5 C), temperature source Axillary, resp. rate 24, height 6\' 1"  (1.854 m), weight 228 lb 9.9 oz (103.7 kg), SpO2 98.00%. Physical Exam  Cardiovascular: Normal rate, regular rhythm and normal heart sounds.   Respiratory: Effort normal. He has wheezes.  GI: Soft. Bowel sounds are normal. There is no tenderness.  Musculoskeletal:  On trach/vent Moves to deep  pain  Psychiatric:  LM to get consent from son on phone     Assessment/Plan CVA- clot retrieval 7/2 On vent/trach No response Malnutrition; decline Will need long term care Scheduled for G tube in am Call into pts son for consent    Jahfari Ambers A 11/20/2012, 1:00 PM

## 2012-11-20 NOTE — Progress Notes (Signed)
PT Cancellation Note  Patient Details Name: Frederick [name] MRN: [id number] DOB: 15-May-1941   Cancelled Treatment:    Reason Eval/Treat Not Completed: Medical issues which prohibited therapy. Not opening eyes, following commands, new trach. 11/20/2012  [name], PT [phone]  (pager)   [name], Eliseo Gum 11/20/2012, 9:58 AM

## 2012-11-20 NOTE — Progress Notes (Signed)
PULMONARY  / CRITICAL CARE MEDICINE  Name: Frederick [name] MRN: [id number] DOB: [birth year]    ADMISSION DATE:  11/14/2012 CONSULTATION DATE:  11/14/2012  REFERRING MD :  Stroke PRIMARY SERVICE:  Stroke  CHIEF COMPLAINT:  Acute respiratory failure  BRIEF PATIENT DESCRIPTION:  72 yo male admitted with seizure and found to have non-occlusive basilar thrombosis, s/p neuro IR.  Remained on vent post-procedure and PCCM consulted.   SIGNIFICANT EVENTS: 7/01 vertebral/carotid angiogram >> recanalization of b/l PCA and basilar arteries 7/02 Dilated Lt pupil  STUDIES:  7/01 CT head >> no acute findings 7/01 CT angio head/neck >> partial occlusive thrombus of basilar artery 7/02 Echo >> EF 45 to 50%, mild LVH, mod LA dilation 7/02 CT head >> developing posterior circulation infarctions 7/03 CT head >> The low densities related to posterior circulation infarctions are becoming more noticeable, but not apparently enlarging.   LINES / TUBES: OETT 7/1 >>>7/6 Trach 7/6>>> R fem art sheath 7/1 >>> 7/02 R rad art line 7/1 >>> 7/03  ANTIBIOTICS: Ancef 7/01 >> off  INTERVAL HISTORY: Tolerating trach well  VITAL SIGNS: Temp:  [98.2 F (36.8 C)-101.4 F (38.6 C)] 99.5 F (37.5 C) (07/07 1143) Pulse Rate:  [71-103] 88 (07/07 1100) Resp:  [15-25] 25 (07/07 1100) BP: (109-152)/(48-91) 116/76 mmHg (07/07 1100) SpO2:  [97 %-100 %] 97 % (07/07 1100) FiO2 (%):  [35 %-41.5 %] 35 % (07/07 0800) Weight:  [103.7 kg (228 lb 9.9 oz)] 103.7 kg (228 lb 9.9 oz) (07/07 0500) VENTILATOR SETTINGS: Vent Mode:  [-] PRVC FiO2 (%):  [35 %-41.5 %] 35 % Set Rate:  [14 bmp] 14 bmp Vt Set:  [500 mL] 500 mL PEEP:  [5 cmH20] 5 cmH20 Plateau Pressure:  [13 cmH20-15 cmH20] 14 cmH20  INTAKE / OUTPUT: Intake/Output     07/06 0701 - 07/07 0700 07/07 0701 - 07/08 0700   P.O.  90   I.V. (mL/kg) 563.5 (5.4) 80 (0.8)   NG/GT 953.8 165.7   Total Intake(mL/kg) 1517.3 (14.6) 335.7 (3.2)   Urine (mL/kg/hr) 1300 (0.5)     Total Output 1300     Net +217.3 +335.7         PHYSICAL EXAMINATION: General:  No distress on trach collar  Neuro: Does not follow commands HEENT: Lt pupil dilated, disconjugate gaze  Cardiovascular:  No M/R/G Lungs: Scattered rhonchi Abdomen:  Soft, nontender, bowel sounds diminished Musculoskeletal:  Trace upper extremity edema Skin:  Intact  LABS: CBC Recent Labs     11/18/12  0525  11/20/12  0420  WBC  6.0  5.4  HGB  11.9*  11.3*  HCT  36.1*  33.7*  PLT  90*  126*   Coag's Recent Labs     11/19/12  0350  APTT  34  INR  1.14    BMET Recent Labs     11/18/12  0525  11/20/12  0420  NA  144  143  K  4.3  4.2  CL  112  108  CO2  22  28  BUN  22  29*  CREATININE  1.09  0.97  GLUCOSE  225*  168*    Electrolytes Recent Labs     11/18/12  0525  11/20/12  0420  CALCIUM  8.4  8.6   ABG No results found for this basename: PHART, PCO2ART, PO2ART,  in the last 72 hours Glucose Recent Labs     11/19/12  1252  11/19/12  1557  11/19/12  1938  11/19/12  2301  11/20/12  0316  11/20/12  0749  GLUCAP  145*  199*  188*  221*  175*  156*    Imaging CXR 7/6 LLL opacity unchanged, Trach tube in good position  ASSESSMENT / PLAN: NEUROLOGIC A:  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 Hx of seizures myasthenia gravis, RLS. P:   -continue ASA -hold requip, ambien -tegretol for seizures per neurology -mestinon per neurology -intermittent sedation  PULMONARY A:  Acute respiratory failure due to inability to protect airway.      RLL ATX      S/p trach 7/6 P:   -begin PSV as tolerated and work towards ATC  CARDIOVASCULAR A:  Hx of A fib hyperlipidemia CAD SSS s/p PM >> followed by [facility] cardiology as outpt P:  -continue lipitor 20 -cardizem -hold full anti-coagulation for now until okay with neurosurgery   RENAL A: Acute kidney injury >>resolved P:   -KVO IV fluids -Bmet in am monitor renal fx, urine outpt,  electrolytes  GASTROINTESTINAL A:  Nutrition. P:   -cont tube feeds while on vent -needs G tube, will consult IR  -protonix for SUP  HEMATOLOGIC A:  Mild anemia thrombocytopenia. P:  -f/u CBC in am  INFECTIOUS A:  No evidence for infection. P:   -monitor clinically  ENDOCRINE  A:  DM type II. P:   -SSI  Today's Summary:  Neuro status remains unchanged.  Will continue plan as per neurology.  Blood pressure is normalizing.  Will continue KVO fluids and follow edema.  If edema continues to increase consider using lasix.  Continue with trach collar weans.  Will consult IR for G tube placement.  Thrombocytopenia improving.      Billy Fischer, MD ; [facility] [phone].  After 5:30 PM or weekends, call [phone]

## 2012-11-20 NOTE — Progress Notes (Signed)
Stroke Team Progress Note  HISTORY Frederick [name] is a 72 y.o. male with a history of seizure disorder, atrial fibrillation, myasthenia gravis, sick sinus syndrome status post pacemaker, diabetes who was last seen normal definitively at 12 PM 11/14/2012.The friend saw him at that time, and then when outside in the sitting in the front porch.  At 6 PM, the friend her the patient cry out and went into see him. The patient stated that something was very wrong, and was slumped over to the left. The patient stated that the friend needed to call 911 and therefore this was done.  On arrival to the ED, the patient was not reliably following commands, had a blown pupil with an hour the deviated on the right and his responses were unintelligible. 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  Given this finding, he was taken for interventional radiology to try and reestablishes posterior circulation. Given that was posterior circulation, an extended window for intervention was used.   Patient was not a TPA candidate secondary to xarelto use, delay in arrival. He was admitted to the neuro ICU post intervention for further evaluation and treatment.  SUBJECTIVE No family at bedside. Remains on vent.  OBJECTIVE Most recent Vital Signs: Filed Vitals:   11/20/12 0600 11/20/12 0700 11/20/12 0750 11/20/12 0800  BP: 121/62 145/67  141/57  Pulse: 92 99  99  Temp:   99.5 F (37.5 C)   TempSrc:   Oral   Resp: 19 18  19   Height:      Weight:      SpO2: 99% 99%  99%   CBG (last 3)   Recent Labs  11/19/12 2301 11/20/12 0316 11/20/12 0749  GLUCAP 221* 175* 156*    IV Fluid Intake:   . sodium chloride 20 mL/hr at 11/20/12 0800  . feeding supplement (PROMOTE) Stopped (11/20/12 0100)  . niCARDipine    . propofol Stopped (11/19/12 1047)    MEDICATIONS  . antiseptic oral rinse  15 mL Mouth  Rinse QID  . aspirin  325 mg Per Tube Daily  . atorvastatin  20 mg Per Tube q1800  . carbamazepine  200 mg Per Tube Q12H  . chlorhexidine  15 mL Mouth Rinse BID  . diltiazem  60 mg Per Tube Q6H  . enoxaparin (LOVENOX) injection  40 mg Subcutaneous Q24H  . feeding supplement  30 mL Per Tube QID  . insulin aspart  0-20 Units Subcutaneous Q4H  . lipase/protease/amylase  2 capsule Oral TID AC  . pantoprazole sodium  40 mg Per Tube Q24H  . pyridostigmine  30 mg Oral Q4H   PRN:  acetaminophen (TYLENOL) oral liquid 160 mg/5 mL, fentaNYL, labetalol, midazolam, ondansetron (ZOFRAN) IV  Diet:  NPO  Activity:  Bedrest DVT Prophylaxis:  SCDs   CLINICALLY SIGNIFICANT STUDIES Basic Metabolic Panel:   Recent Labs Lab 11/18/12 0525 11/20/12 0420  NA 144 143  K 4.3 4.2  CL 112 108  CO2 22 28  GLUCOSE 225* 168*  BUN 22 29*  CREATININE 1.09 0.97  CALCIUM 8.4 8.6   Liver Function Tests:   Recent Labs Lab 11/14/12 2002 11/18/12 0525  AST 19 18  ALT 14 8  ALKPHOS 89 90  BILITOT 0.9 0.7  PROT 6.3 6.0  ALBUMIN 3.4* 2.9*   CBC:   Recent Labs Lab 11/14/12 2002  11/15/12 0611  11/18/12  0525 11/20/12 0420  WBC 6.1  --  5.1  < > 6.0 5.4  NEUTROABS 3.5  --  3.5  --   --   --   HGB 13.2  < > 11.1*  < > 11.9* 11.3*  HCT 37.2*  < > 31.3*  < > 36.1* 33.7*  MCV 86.1  --  86.9  < > 90.9 88.9  PLT 148*  --  120*  < > 90* 126*  < > = values in this interval not displayed. Coagulation:   Recent Labs Lab 11/14/12 2002 11/19/12 0350  LABPROT 14.9 14.4  INR 1.20 1.14   Cardiac Enzymes:   Recent Labs Lab 11/14/12 2003  TROPONINI <0.30   Urinalysis: No results found for this basename: COLORURINE, APPERANCEUR, LABSPEC, PHURINE, GLUCOSEU, HGBUR, BILIRUBINUR, KETONESUR, PROTEINUR, UROBILINOGEN, NITRITE, LEUKOCYTESUR,  in the last 168 hours Lipid Panel    Component Value Date/Time   CHOL 100 11/15/2012 0611   TRIG 205* 11/15/2012 0611   HDL 20* 11/15/2012 0611   CHOLHDL 5.0 11/15/2012  0611   VLDL 41* 11/15/2012 0611   LDLCALC 39 11/15/2012 0611   HgbA1C  Lab Results  Component Value Date   HGBA1C 6.3* 11/15/2012    Urine Drug Screen:   No results found for this basename: labopia,  cocainscrnur,  labbenz,  amphetmu,  thcu,  labbarb    Alcohol Level: No results found for this basename: ETH,  in the last 168 hours  CT of the brain   11/16/2012    The low densities related to posterior circulation infarctions are becoming more noticeable, but not apparently enlarging. There is no hemorrhage or significant mass effect/shift. The areas of involvement include the cerebellum (more extensive on the left than the right, the mid brain and thalami (more extensive on the right than the left) in the left occipital cortical and subcortical region.   11/15/2012    No interval change or complicating features following angiography.    11/14/2012    No acute finding.  Chronic microvascular ischemic change.   CT Angio Head 11/14/2012   Question partially occlusive thrombus within the tip of the basilar artery.    CT Angio Neck 11/14/2012    1.  No CT evidence of dissection, high-grade stenosis, or aneurysm in the neck.  2. Atelectasis and /or consolidation within the partially visualized lungs, incompletely evaluated.      Cerebral angio S/P bilateral vert artery and bilateral common carotid aretry angiograms ,followed by partial angiographic recanalization of both PCAs and basilar artery occusion using 2 passes each with trevoprovue and Solitaire devices and 9.6 mg of Integrelin IA   MRI of the brain  CANNOT HAVE-HAS PPM NOT MRI COMPATIBLE  MRA of the brain  See angio  2D Echocardiogram  Ef 45-50% Diffuse HK  Carotid Doppler  See angio  CXR   11/19/2012 1. No change in aeration to the left lung base.  2. Satisfactory position of the tracheostomy tube. 11/17/2012 1. Slightly improved right basilar opacity may reflect resolving  atelectasis. 2. Persistent dense left basilar opacity consistent with   atelectasis versus infiltrate. Difficult to exclude a small left pleural effusion.  3. Stable and satisfactory support apparatus. 11/16/2012 1. Support apparatus. 2. Worsening bibasilar (left greater than right) atelectasis and/or consolidation, with superimposed small left and trace right- sided pleural effusions.  3. Mild cardiomegaly. 4. Atherosclerosis. 11/15/2012    1.  Support apparatus in good position. 2.  Cardiomegaly with diffuse interstitial prominence suggesting interstitial pulmonary  edema. 3.  Basilar atelectasis.   EKG  atrial fibrillation, rate 101.   Therapy Recommendations   Physical Exam   Elderly male intubated not in distress. . Afebrile. Head is nontraumatic. Neck is supple without bruit.  . Cardiac exam no murmur or gallop. Lungs are clear to auscultation. Distal pulses are well felt. Neurological Exam :  stuporose arouses minimally to sternal rub. Eyes deviated downwards with vertical skew.. Pupils unequal with left 5 mm and right 2 mm and unreactive.. corneals preserved. Fundi not visiualized. Cough and gag weak. Mild bifacial weakness . Moves right side purposefully and localizes and now moves left side semipurposefully to touch.. Tone is normal..Plantars upgoing bilaterally.  ASSESSMENT [name] is a 72 y.o. male presenting with after slumping to the left - upon arrival he  was not reliably following commands, had a blown pupil within an hour ,the eyes deviated on the right and his responses were unintelligible. 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 Dr. Corliss Skains performed a partial recanalization of bilateral PCAs and basilar artery occlusion using mechanical embolectomy with solitaire device as well as IA Integrilin. Imaging confirms developing PCA infarcts; brainstem infarcts seen on subsequent CT. Infarcts felt to be due to top of the basilar syndrome embolic secondary to  atrial fibrillation.  On xarelto prior to admission. Now on aspirin 325 mg orally every day for secondary stroke prevention. 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 Work up completed.   Atrial fibrillation, on xarelto prior to admission Diabetes, HgbA1c pending, goal < 7.0 Seizure d/o. Home meds resumed Hx myasthenia gravis restless leg syndrome Hyperlipidemia, LDL 39, on lipitor 20 PTA, now on lipitor 20, goal LDL < 100 (< 70 for diabetics) Hypokalemia CAD SSS with pacer Thrombocytopenia, platelets 126  Hospital day # 6  TREATMENT/PLAN  Continue ASA 325 for secondary stroke prevention  Systolic blood pressure goal 120-140  Plans for PEG, has been NPO overnight  Transfer to CCM as attending. Dr. Sung Amabile has agreed.  Annie Main, MSN, RN, ANVP-BC, ANP-BC, [facility] Pager: [phone] 11/20/2012 9:03 AM  I have personally obtained a history, examined the patient, evaluated imaging results, and formulated the assessment and plan of care. I agree with the above. This patient is critically ill and at significant risk of neurological worsening, death and care requires constant monitoring of vital signs, hemodynamics,respiratory and cardiac monitoring,review of multiple databases, neurological assessment, discussion with family, other specialists and medical decision making of high complexity. I spent 30 minutes of neurocritical care time  in the care of  this patient. Delia Heady, MD

## 2012-11-20 NOTE — Progress Notes (Signed)
Spoke with nursing.  Pt cont to be mildly sedated.  Did receive trach over the weekend.  Is schedule for a PEG tomorrow.  Will attempt back  after pt receives peg due to pt currently not following any commands.   Tory Emerald, [place] [phone]

## 2012-11-21 ENCOUNTER — Inpatient Hospital Stay (HOSPITAL_COMMUNITY): Payer: Medicare Other

## 2012-11-21 LAB — GLUCOSE, CAPILLARY
Glucose-Capillary: 187 mg/dL — ABNORMAL HIGH (ref 70–99)
Glucose-Capillary: 163 mg/dL — ABNORMAL HIGH (ref 70–99)

## 2012-11-21 MED ORDER — HYDRALAZINE HCL 20 MG/ML IJ SOLN: 10.0000 mg | INTRAMUSCULAR | Status: DC | PRN

## 2012-11-21 MED ORDER — PNEUMOCOCCAL VAC POLYVALENT 25 MCG/0.5ML IJ INJ
0.5000 mL | INJECTION | INTRAMUSCULAR | Status: AC
  Administered 2012-11-23: 0.5 mL via INTRAMUSCULAR
  Filled 2012-11-21: qty 0.5

## 2012-11-21 MED ORDER — METOPROLOL TARTRATE 1 MG/ML IV SOLN
2.5000 mg | INTRAVENOUS | Status: DC | PRN
  Filled 2012-11-21: qty 5

## 2012-11-21 NOTE — Progress Notes (Addendum)
Inpatient Diabetes Program Recommendations  AACE/ADA: New Consensus Statement on Inpatient Glycemic Control (2013)  Target Ranges:  Prepandial:   less than 140 mg/dL      Peak postprandial:   less than 180 mg/dL (1-2 hours)      Critically ill patients:  140 - 180 mg/dL  Hyperglycemia in 161'W consistently  Inpatient Diabetes Program Recommendations Insulin - Basal: Pt takes 25 units lantus per day.  Please add lantus15 units to start. Or can use the ICU Hyperglycemia protocol while NPO.  Thank you, Lenor Coffin, RN, CNS, Diabetes Coordinator [phone])

## 2012-11-21 NOTE — Progress Notes (Signed)
RN attempted calling patient's sister(Hite) and his son Ledo) to obtain consent for peg placement but to no avail, therefore will communicate to incoming staff. Will continue with tube feeding for now untill we hear from family.

## 2012-11-21 NOTE — Progress Notes (Signed)
PULMONARY  / CRITICAL CARE MEDICINE  Name: Frederick [name] MRN: [id number] DOB: 09-Nov-1940    ADMISSION DATE:  11/14/2012 CONSULTATION DATE:  11/14/2012  REFERRING MD :  Stroke PRIMARY SERVICE:  Stroke  CHIEF COMPLAINT:  Acute respiratory failure  BRIEF PATIENT DESCRIPTION:  72 yo male admitted with seizure and found to have non-occlusive basilar thrombosis, s/p neuro IR.  Remained on vent post-procedure and PCCM consulted.   SIGNIFICANT EVENTS: 7/01 vertebral/carotid angiogram >> recanalization of b/l PCA and basilar arteries 7/02 Dilated Lt pupil 7/06 trach  STUDIES:  7/01 CT head >> no acute findings 7/01 CT angio head/neck >> partial occlusive thrombus of basilar artery 7/02 Echo >> EF 45 to 50%, mild LVH, mod LA dilation 7/02 CT head >> developing posterior circulation infarctions 7/03 CT head >> The low densities related to posterior circulation infarctions are becoming more noticeable, but not apparently enlarging.   LINES / TUBES: OETT 7/1 >>>7/6 Trach 7/6>>> R fem art sheath 7/1 >>> 7/02 R rad art line 7/1 >>> 7/03  ANTIBIOTICS: Ancef 7/01 >> off  INTERVAL HISTORY: Tolerating trach well  VITAL SIGNS: Temp:  [98.8 F (37.1 C)-102.6 F (39.2 C)] 98.8 F (37.1 C) (07/08 0758) Pulse Rate:  [79-108] 99 (07/08 1000) Resp:  [20-29] 24 (07/08 0900) BP: (116-172)/(50-89) 137/67 mmHg (07/08 1000) SpO2:  [94 %-99 %] 96 % (07/08 1000) FiO2 (%):  [35 %] 35 % (07/08 0800) Weight:  [98.5 kg (217 lb 2.5 oz)] 98.5 kg (217 lb 2.5 oz) (07/08 0500) VENTILATOR SETTINGS: Vent Mode:  [-]  FiO2 (%):  [35 %] 35 %  INTAKE / OUTPUT: Intake/Output     07/07 0701 - 07/08 0700 07/08 0701 - 07/09 0700   P.O. 570    I.V. (mL/kg) 354.7 (3.6) 30 (0.3)   NG/GT 1075.7    Total Intake(mL/kg) 2000.3 (20.3) 30 (0.3)   Urine (mL/kg/hr) 2400 (1)    Total Output 2400     Net -399.7 +30         PHYSICAL EXAMINATION: General:  No distress on trach collar  Neuro: Does not follow  commands HEENT: Lt pupil dilated, disconjugate gaze  Cardiovascular:  No M/R/G Lungs: Scattered rhonchi Abdomen:  Soft, nontender, bowel sounds diminished Musculoskeletal:  1+ upper extremity edema Skin:  Intact  LABS: CBC Recent Labs     11/20/12  0420  WBC  5.4  HGB  11.3*  HCT  33.7*  PLT  126*   Coag's Recent Labs     11/19/12  0350  APTT  34  INR  1.14    BMET Recent Labs     11/20/12  0420  NA  143  K  4.2  CL  108  CO2  28  BUN  29*  CREATININE  0.97  GLUCOSE  168*    Electrolytes Recent Labs     11/20/12  0420  CALCIUM  8.6   ABG No results found for this basename: PHART, PCO2ART, PO2ART,  in the last 72 hours Glucose Recent Labs     11/20/12  1141  11/20/12  1618  11/20/12  2016  11/21/12  0048  11/21/12  0432  11/21/12  0820  GLUCAP  226*  215*  257*  187*  179*  163*    Imaging CXR 7/6 LLL opacity unchanged, Trach tube in good position  ASSESSMENT / PLAN: NEUROLOGIC A:  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 Hx of seizures myasthenia gravis, RLS. P:   -continue  ASA -hold requip, ambien -tegretol for seizures per neurology -mestinon per neurology -intermittent sedation prn  PULMONARY A:  Acute respiratory failure due to inability to protect airway.      RLL ATX      S/p trach 7/6 P:   -begin PSV as tolerated and work towards ATC  CARDIOVASCULAR A:  Hx of A fib hyperlipidemia CAD SSS s/p PM >> followed by [facility] cardiology as outpt P:  -continue lipitor 20 -cardizem -hold full anti-coagulation for now until okay with neurosurgery   RENAL A: Acute kidney injury >>resolved P:   -KVO IV fluids -d/c daily labs  GASTROINTESTINAL A:  Nutrition. P:   -cont tube feeds while on vent -will currently hold G tube for now -protonix for SUP  HEMATOLOGIC A:  Mild anemia thrombocytopenia. P:  -d/c daily cbcs  INFECTIOUS A:  No evidence for infection. P:   -monitor clinically  ENDOCRINE  A:  DM  type II. P:   -SSI  Today's Summary:  Neuro status remains unchanged.  Will continue plan as per neurology.  Blood pressure is normalizing.  Will continue KVO fluids.  Will d/c daily labs for now.  Will talk to the family and determine the status of G tube placement and possibility of going to comfort care based on family wishes.  Will continue current support for now.  Will move to a regular bed.      Carley Hammed Physician Assistant Student 11/21/12, 10:39 am   I have interviewed and examined the patient and reviewed the database. I have formulated the assessment and plan as reflected in the note above with amendments made by me.   Apparently, pt's son could not be reached for G tube consent. On further eval, given poor prognosis, it might be reasonable to leave NGT as he is at very low risk of pulling it out (since he does not reach for it and exhibits no purposeful intention). Of course, placement might become an issue without G tube. It seems that he is potentially a candidate for inpatient Hospice and they might take him without G tube. As the prognosis is so poor, we could also consider removal of the NGT and withholding TFs altogether  Billy Fischer, MD;  PCCM service; Mobile [phone]

## 2012-11-21 NOTE — Progress Notes (Signed)
Stroke Team Progress Note  HISTORY Frederick [name] is a 72 y.o. male with a history of seizure disorder, atrial fibrillation, myasthenia gravis, sick sinus syndrome status post pacemaker, diabetes who was last seen normal definitively at 12 PM 11/14/2012.The friend saw him at that time, and then when outside in the sitting in the front porch.  At 6 PM, the friend her the patient cry out and went into see him. The patient stated that something was very wrong, and was slumped over to the left. The patient stated that the friend needed to call 911 and therefore this was done.  On arrival to the ED, the patient was not reliably following commands, had a blown pupil with an hour the deviated on the right and his responses were unintelligible. 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  Given this finding, he was taken for interventional radiology to try and reestablishes posterior circulation. Given that was posterior circulation, an extended window for intervention was used.   Patient was not a TPA candidate secondary to xarelto use, delay in arrival. He was admitted to the neuro ICU post intervention for further evaluation and treatment.  SUBJECTIVE No family at bedside. Patient without interaction.  OBJECTIVE Most recent Vital Signs: Filed Vitals:   11/21/12 0417 11/21/12 0500 11/21/12 0600 11/21/12 0758  BP:  143/70 131/62 120/62  Pulse: 91 97 103 98  Temp: 100.4 F (38 C)   98.8 F (37.1 C)  TempSrc: Oral   Axillary  Resp: 26 20 23 25   Height:      Weight:  98.5 kg (217 lb 2.5 oz)    SpO2: 98% 97% 98% 97%   CBG (last 3)   Recent Labs  11/21/12 0048 11/21/12 0432 11/21/12 0820  GLUCAP 187* 179* 163*    IV Fluid Intake:   . sodium chloride 10 mL/hr at 11/20/12 2000  . feeding supplement (PROMOTE) Stopped (11/21/12 0000)  . niCARDipine    . propofol Stopped  (11/19/12 1047)    MEDICATIONS  . antiseptic oral rinse  15 mL Mouth Rinse QID  . aspirin  325 mg Per Tube Daily  . atorvastatin  20 mg Per Tube q1800  . carbamazepine  200 mg Per Tube Q12H  . chlorhexidine  15 mL Mouth Rinse BID  . diltiazem  60 mg Per Tube Q6H  . enoxaparin (LOVENOX) injection  40 mg Subcutaneous Q24H  . feeding supplement  30 mL Per Tube QID  . insulin aspart  0-20 Units Subcutaneous Q4H  . lipase/protease/amylase  2 capsule Oral TID AC  . pantoprazole sodium  40 mg Per Tube Q24H  . [START ON 11/22/2012] pneumococcal 23 valent vaccine  0.5 mL Intramuscular Tomorrow-1000  . pyridostigmine  30 mg Oral Q4H   PRN:  acetaminophen (TYLENOL) oral liquid 160 mg/5 mL, fentaNYL, labetalol, midazolam, ondansetron (ZOFRAN) IV  Diet:  NPO  Activity:  Bedrest DVT Prophylaxis:  SCDs   CLINICALLY SIGNIFICANT STUDIES Basic Metabolic Panel:   Recent Labs Lab 11/18/12 0525 11/20/12 0420  NA 144 143  K 4.3 4.2  CL 112 108  CO2 22 28  GLUCOSE 225* 168*  BUN 22 29*  CREATININE 1.09 0.97  CALCIUM 8.4 8.6   Liver Function Tests:   Recent Labs Lab 11/14/12 2002 11/18/12 0525  AST 19 18  ALT 14 8  ALKPHOS 89 90  BILITOT 0.9 0.7  PROT 6.3 6.0  ALBUMIN 3.4* 2.9*   CBC:   Recent Labs Lab 11/14/12 2002  11/15/12 0611  11/18/12 0525 11/20/12 0420  WBC 6.1  --  5.1  < > 6.0 5.4  NEUTROABS 3.5  --  3.5  --   --   --   HGB 13.2  < > 11.1*  < > 11.9* 11.3*  HCT 37.2*  < > 31.3*  < > 36.1* 33.7*  MCV 86.1  --  86.9  < > 90.9 88.9  PLT 148*  --  120*  < > 90* 126*  < > = values in this interval not displayed. Coagulation:   Recent Labs Lab 11/14/12 2002 11/19/12 0350  LABPROT 14.9 14.4  INR 1.20 1.14   Cardiac Enzymes:   Recent Labs Lab 11/14/12 2003  TROPONINI <0.30   Urinalysis: No results found for this basename: COLORURINE, APPERANCEUR, LABSPEC, PHURINE, GLUCOSEU, HGBUR, BILIRUBINUR, KETONESUR, PROTEINUR, UROBILINOGEN, NITRITE, LEUKOCYTESUR,  in the  last 168 hours Lipid Panel    Component Value Date/Time   CHOL 100 11/15/2012 0611   TRIG 205* 11/15/2012 0611   HDL 20* 11/15/2012 0611   CHOLHDL 5.0 11/15/2012 0611   VLDL 41* 11/15/2012 0611   LDLCALC 39 11/15/2012 0611   HgbA1C  Lab Results  Component Value Date   HGBA1C 6.3* 11/15/2012    Urine Drug Screen:   No results found for this basename: labopia,  cocainscrnur,  labbenz,  amphetmu,  thcu,  labbarb    Alcohol Level: No results found for this basename: ETH,  in the last 168 hours  CT of the brain   11/16/2012    The low densities related to posterior circulation infarctions are becoming more noticeable, but not apparently enlarging. There is no hemorrhage or significant mass effect/shift. The areas of involvement include the cerebellum (more extensive on the left than the right, the mid brain and thalami (more extensive on the right than the left) in the left occipital cortical and subcortical region.   11/15/2012    No interval change or complicating features following angiography.    11/14/2012    No acute finding.  Chronic microvascular ischemic change.   CT Angio Head 11/14/2012   Question partially occlusive thrombus within the tip of the basilar artery.    CT Angio Neck 11/14/2012    1.  No CT evidence of dissection, high-grade stenosis, or aneurysm in the neck.  2. Atelectasis and /or consolidation within the partially visualized lungs, incompletely evaluated.      Cerebral angio S/P bilateral vert artery and bilateral common carotid aretry angiograms ,followed by partial angiographic recanalization of both PCAs and basilar artery occusion using 2 passes each with trevoprovue and Solitaire devices and 9.6 mg of Integrelin IA   MRI of the brain  CANNOT HAVE-HAS PPM NOT MRI COMPATIBLE  MRA of the brain  See angio  2D Echocardiogram  Ef 45-50% Diffuse HK  Carotid Doppler  See angio  CXR   11/19/2012 1. No change in aeration to the left lung base.  2. Satisfactory position of the  tracheostomy tube. 11/17/2012 1. Slightly improved right basilar opacity may reflect resolving  atelectasis. 2. Persistent dense left basilar opacity consistent with  atelectasis versus infiltrate. Difficult to exclude a small left pleural effusion.  3. Stable and satisfactory support apparatus. 11/16/2012 1. Support apparatus. 2. Worsening bibasilar (left greater than right) atelectasis and/or consolidation, with superimposed small left and trace right- sided pleural effusions.  3. Mild cardiomegaly. 4.  Atherosclerosis. 11/15/2012    1.  Support apparatus in good position. 2.  Cardiomegaly with diffuse interstitial prominence suggesting interstitial pulmonary edema. 3.  Basilar atelectasis.   EKG  atrial fibrillation, rate 101.   Therapy Recommendations   Physical Exam   Elderly male intubated not in distress. . Afebrile. Head is nontraumatic. Neck is supple without bruit.  . Cardiac exam no murmur or gallop. Lungs are clear to auscultation. Distal pulses are well felt. Neurological Exam :  stuporose arouses minimally to sternal rub. Eyes deviated downwards with vertical skew.. Pupils unequal with left 5 mm and right 2 mm and unreactive.. corneals preserved. Fundi not visiualized. Cough and gag weak. Mild bifacial weakness . Moves right side purposefully and localizes and now moves left side semipurposefully to touch.. Tone is normal..Plantars upgoing bilaterally.  ASSESSMENT [name] is a 72 y.o. male presenting with after slumping to the left - upon arrival he  was not reliably following commands, had a blown pupil within an hour ,the eyes deviated on the right and his responses were unintelligible. 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 Dr. Corliss Skains performed a partial recanalization of bilateral PCAs and basilar artery occlusion using mechanical embolectomy with solitaire device as well as IA Integrilin.  Imaging confirms developing PCA infarcts; brainstem infarcts seen on subsequent CT. Infarcts felt to be due to top of the basilar syndrome embolic secondary to atrial fibrillation.  On xarelto prior to admission. Now on aspirin 325 mg orally every day for secondary stroke prevention. 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 Work up completed.   Atrial fibrillation, on xarelto prior to admission Diabetes, HgbA1c pending, goal < 7.0 Seizure d/o. Home meds resumed Hx myasthenia gravis restless leg syndrome Hyperlipidemia, LDL 39, on lipitor 20 PTA, now on lipitor 20, goal LDL < 100 (< 70 for diabetics) Hypokalemia CAD SSS with pacer Thrombocytopenia, platelets 126  Hospital day # 7  TREATMENT/PLAN  Continue ASA 325 for secondary stroke prevention  Systolic blood pressure goal 120-140  Plans for PEG, has been NPO overnight - unable to reach son, MDs questioning if feeding is appropriate and what family would want long-term.  RN will continue to try to reach son and Dr. Sung Amabile will speak with him again related to desired treatment and long-term plans.  Annie Main, MSN, RN, ANVP-BC, ANP-BC, [facility] Pager: [phone] 11/21/2012 8:57 AM  I have personally obtained a history, examined the patient, evaluated imaging results, and formulated the assessment and plan of care. I agree with the above.  Delia Heady, MD

## 2012-11-21 NOTE — Progress Notes (Signed)
UR completed on chart.  If pt/family elects to have PEG placed, pt would be a LTACH candidate.

## 2012-11-21 NOTE — Progress Notes (Signed)
Trach Team   Pt weaning from vent, ATC trials. Please order PMSV when ready. Thanks. Harlon Ditty, MA CCC-SLP [phone]

## 2012-11-22 ENCOUNTER — Inpatient Hospital Stay (HOSPITAL_COMMUNITY): Payer: Medicare Other

## 2012-11-22 LAB — GLUCOSE, CAPILLARY
Glucose-Capillary: 217 mg/dL — ABNORMAL HIGH (ref 70–99)
Glucose-Capillary: 175 mg/dL — ABNORMAL HIGH (ref 70–99)
Glucose-Capillary: 202 mg/dL — ABNORMAL HIGH (ref 70–99)
Glucose-Capillary: 171 mg/dL — ABNORMAL HIGH (ref 70–99)
Glucose-Capillary: 190 mg/dL — ABNORMAL HIGH (ref 70–99)

## 2012-11-22 MED ORDER — MIDAZOLAM HCL 2 MG/2ML IJ SOLN
INTRAMUSCULAR | Status: AC | PRN
  Administered 2012-11-22: 0.5 mg via INTRAVENOUS

## 2012-11-22 MED ORDER — FENTANYL CITRATE 0.05 MG/ML IJ SOLN
INTRAMUSCULAR | Status: AC | PRN
  Administered 2012-11-22: 25 ug via INTRAVENOUS

## 2012-11-22 MED ORDER — IOHEXOL 300 MG/ML  SOLN
50.0000 mL | Freq: Once | INTRAMUSCULAR | Status: AC | PRN
  Administered 2012-11-22: 20 mL

## 2012-11-22 MED ORDER — CEFAZOLIN SODIUM-DEXTROSE 2-3 GM-% IV SOLR
2.0000 g | Freq: Once | INTRAVENOUS | Status: AC
  Administered 2012-11-22: 2 g via INTRAVENOUS

## 2012-11-22 NOTE — Procedures (Signed)
Successful placement of 20 French gastrostomy tube.  No immediate complication.    

## 2012-11-22 NOTE — Progress Notes (Signed)
PULMONARY  / CRITICAL CARE MEDICINE  Name: Frederick [name] MRN: [id number] DOB: [birth year]    ADMISSION DATE:  11/14/2012 CONSULTATION DATE:  11/14/2012  REFERRING MD :  Stroke PRIMARY SERVICE:  Stroke  CHIEF COMPLAINT:  Acute respiratory failure  BRIEF PATIENT DESCRIPTION:  72 yo male admitted with seizure and found to have non-occlusive basilar thrombosis, s/p neuro IR.  Remained on vent post-procedure and PCCM consulted.   SIGNIFICANT EVENTS: 7/01 vertebral/carotid angiogram >> recanalization of b/l PCA and basilar arteries 7/02 Dilated Lt pupil 7/06 trach  STUDIES:  7/01 CT head >> no acute findings 7/01 CT angio head/neck >> partial occlusive thrombus of basilar artery 7/02 Echo >> EF 45 to 50%, mild LVH, mod LA dilation 7/02 CT head >> developing posterior circulation infarctions 7/03 CT head >> The low densities related to posterior circulation infarctions are becoming more noticeable, but not apparently enlarging.   LINES / TUBES: OETT 7/1 >>>7/6 Trach 7/6>>> R fem art sheath 7/1 >>> 7/02 R rad art line 7/1 >>> 7/03  ANTIBIOTICS: Ancef 7/01 >> off  INTERVAL HISTORY: Tolerating trach well Moderate  Secretions  On 35%  VITAL SIGNS: Temp:  [97.5 F (36.4 C)-101.8 F (38.8 C)] 100 F (37.8 C) (07/09 1405) Pulse Rate:  [66-122] 86 (07/09 1405) Resp:  [16-28] 20 (07/09 1405) BP: (116-164)/(54-97) 116/54 mmHg (07/09 1405) SpO2:  [94 %-99 %] 96 % (07/09 1405) FiO2 (%):  [35 %] 35 % (07/09 1405) VENTILATOR SETTINGS: Vent Mode:  [-]  FiO2 (%):  [35 %] 35 %  INTAKE / OUTPUT: Intake/Output     07/08 0701 - 07/09 0700 07/09 0701 - 07/10 0700   P.O. 40    I.V. (mL/kg) 120 (1.2)    NG/GT 420    Total Intake(mL/kg) 580 (5.9)    Urine (mL/kg/hr) 975 (0.4) 975 (1.1)   Total Output 975 975   Net -395 -975         PHYSICAL EXAMINATION: General:  No distress on trach collar  Neuro: Does not follow commands HEENT: Lt pupil dilated, disconjugate gaze   Cardiovascular:  No M/R/G Lungs: Scattered rhonchi Abdomen:  Soft, nontender, bowel sounds diminished Musculoskeletal:  1+ upper extremity edema Skin:  Intact  LABS: CBC Recent Labs     11/20/12  0420  WBC  5.4  HGB  11.3*  HCT  33.7*  PLT  126*   Coag's No results found for this basename: APTT, INR,  in the last 72 hours  BMET Recent Labs     11/20/12  0420  NA  143  K  4.2  CL  108  CO2  28  BUN  29*  CREATININE  0.97  GLUCOSE  168*    Electrolytes Recent Labs     11/20/12  0420  CALCIUM  8.6   ABG No results found for this basename: PHART, PCO2ART, PO2ART,  in the last 72 hours Glucose Recent Labs     11/21/12  1632  11/21/12  2027  11/22/12  0003  11/22/12  0415  11/22/12  0817  11/22/12  1212  GLUCAP  177*  246*  265*  217*  175*  202*    Imaging CXR 7/6 LLL opacity unchanged, Trach tube in good position  ASSESSMENT / PLAN: NEUROLOGIC A:  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 Hx of seizures myasthenia gravis, RLS. P:   -continue ASA -hold requip, ambien -tegretol for seizures per neurology -mestinon per neurology -intermittent sedation prn  PULMONARY  A:  Acute respiratory failure due to inability to protect airway.      RLL ATX      S/p trach 7/6 P:   -ATC as toelrated -drop to 28%  CARDIOVASCULAR A:  Hx of A fib hyperlipidemia CAD SSS s/p PM >> followed by [facility] cardiology as outpt P:  -continue lipitor 20 -cardizem -hold full anti-coagulation for now until okay with neurosurgery   RENAL A: Acute kidney injury >>resolved P:   -KVO IV fluids -d/c daily labs  GASTROINTESTINAL A:  Nutrition. P:   -resume tube feeds via G tube in 24 h -protonix for SUP  HEMATOLOGIC A:  Mild anemia thrombocytopenia. P:  -d/c daily cbcs  INFECTIOUS A:  No evidence for infection. P:   -monitor clinically  ENDOCRINE  A:  DM type II. P:   -SSI  Today's Summary:  DC options include SNF vs LTAC since he is  down to 28% Hospice remains a consideration if he worsens  Cyril Mourning MD. FCCP. [facility] Pager [phone] If no response call [phone]

## 2012-11-22 NOTE — Progress Notes (Signed)
OT Cancellation Note  Patient Details Name: Frederick [name] MRN: [id number] DOB: 07-Nov-1940   Cancelled Treatment:    Reason Eval/Treat Not Completed: Patient at procedure or test/ unavailable - pt underwent PEG placement.  Will reattempt tomrrow  [name] M 11/22/2012, 1:15 PM

## 2012-11-22 NOTE — Care Management Note (Signed)
    Page 1 of 2   11/23/2012     2:34:00 PM   CARE MANAGEMENT NOTE 11/23/2012  Patient:  Frederick [name], Frederick [name]   Account Number:  [id number]  Date Initiated:  11/21/2012  Documentation initiated by:  Carlyle Lipa  Subjective/Objective Assessment:   CVA; VDRF; trach placed on hospital day 5     Action/Plan:   await family meeting to determine if PEG should be placed or family would want comfort care; LTACHs aware of potential need   Anticipated DC Date:  11/25/2012   Anticipated DC Plan:  SKILLED NURSING FACILITY      DC Planning Services  CM consult      Choice offered to / List presented to:             Status of service:  Completed, signed off Medicare Important Message given?   (If response is "NO", the following Medicare IM given date fields will be blank) Date Medicare IM given:   Date Additional Medicare IM given:    Discharge Disposition:  LONG TERM ACUTE CARE (LTAC)  Per UR Regulation:  Reviewed for med. necessity/level of care/duration of stay  If discussed at Long Length of Stay Meetings, dates discussed:   11/21/2012    Comments:  11/24/12 1330 Elmer Bales RN, MSN CM-  Met with patient's son to discuss LTAC options.  Pt's son is agreeable to LTAC, preferring [facility], but willing to take first bed available at either place.  Spoke with Florentina Addison NP to verify that patient will be ready for transfer today. Katie NP to come speak with patient's son regarding status and plan of care.  Per Boneta Lucks, [employer], there is a bed available for patient today. Pt's son requested information on obtaining POA documents.  Spoke with CSW to pass on information for obtaining POA through the Dept of Social Services. Plan is to transfer patient today. Pt's son and RN are aware.    11/23/12 1115 Elmer Bales RN, MSN CM- Spoke with patient's son via telephone to discuss discharge plans. Pt's son states that he is currenty running errands, but intends to come to visit once he is done.   CM plans to meet with son to discuss the possibility of LTACH. Pt's RN aware.    11/22/12 1600 Elmer Bales RN, MSN CM- Spoke with Dr Vassie Loll regarding possibility of patient being appropriate for LTAC.  Dr Vassie Loll is in agreement that patient my need LTAC as a bridge before his trach size and settings are appropriate for SNF.  Voicemail was left with patient's son to discuss LTAC option.  Pt's RN was updated.  11/22/12 1030 Courtney Robarge RN, MSN CM- Spoke with [facility] and [facility]ist to request a chart review to determine if patient is an LTAC candidate.

## 2012-11-22 NOTE — Progress Notes (Signed)
NUTRITION FOLLOW UP  Intervention:   1. Recommend once PEG has been cleared for use, initiate Promote @ 35 ml/hr and advance by 10 ml q 4 hr to previous goal rate of 70 ml/hr.  2. Continue Pro-stat supplement QID.   Nutrition Dx:   Inadequate oral intake related to inability to eat as evidenced by NPO status.   Goal:   Underfeeding goal no longer applicable as pt is no longer mees BMI or acuity criteria.  New Goal: Meet >/=90% estimated nutrition needs with enteral nutrition.   Monitor:   TF tolerance, weight trends, labs, I/O's, diet advance.   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 Remained intubated post procedure and was started on enteral nutrition via NG tube. S/p bronch and trach placement on 7/6. Now off ventilator support, transferred to floor. Pt remained on enteral nutrition via NG tube until PEG placed today.   Pt current enteral nutrition regimen is Promote @ 70 ml/hr with Pro-stat supplements QID. This provides 2080 kcal, 165 gm protein, and 1410 ml free water daily. TF was held for PEG placement. Rn reports likely will be able to use PEG tomorrow. Recommend restart enteral nutrition as medically given pt's weight loss this admission.   Height: Ht Readings from Last 1 Encounters:  11/15/12 6\' 1"  (1.854 m)    Weight Status:   Wt Readings from Last 1 Encounters:  11/21/12 217 lb 2.5 oz (98.5 kg)  Weight is down from 232 at admission.  Body mass index is 28.66 kg/(m^2).   Re-estimated needs:  Kcal: 2000-2200 Protein: 100-110 gm  Fluid: 2-2.2 L   Skin: incision in neck  Diet Order: NPO   Intake/Output Summary (Last 24 hours) at 11/22/12 1520 Last data filed at 11/22/12 1012  Gross per 24 hour  Intake    220 ml  Output   1350 ml  Net  -1130 ml    Last BM: 7/4   Labs:   Recent Labs Lab 11/17/12 1000 11/18/12 0525 11/20/12 0420  NA 143 144 143  K 3.5 4.3 4.2  CL 112 112 108   CO2 23 22 28   BUN 16 22 29*  CREATININE 1.08 1.09 0.97  CALCIUM 8.1* 8.4 8.6  GLUCOSE 216* 225* 168*    CBG (last 3)   Recent Labs  11/22/12 0415 11/22/12 0817 11/22/12 1212  GLUCAP 217* 175* 202*    Scheduled Meds: . antiseptic oral rinse  15 mL Mouth Rinse QID  . aspirin  325 mg Per Tube Daily  . atorvastatin  20 mg Per Tube q1800  . carbamazepine  200 mg Per Tube Q12H  . chlorhexidine  15 mL Mouth Rinse BID  . diltiazem  60 mg Per Tube Q6H  . enoxaparin (LOVENOX) injection  40 mg Subcutaneous Q24H  . feeding supplement  30 mL Per Tube QID  . insulin aspart  0-20 Units Subcutaneous Q4H  . lipase/protease/amylase  2 capsule Oral TID AC  . pneumococcal 23 valent vaccine  0.5 mL Intramuscular Tomorrow-1000  . pyridostigmine  30 mg Oral Q4H    Continuous Infusions: . feeding supplement (PROMOTE) 1,000 mL (11/21/12 1828)    Clarene Duke RD, LDN Pager [phone] After Hours pager [phone]

## 2012-11-22 NOTE — Progress Notes (Signed)
Inpatient Diabetes Program Recommendations  AACE/ADA: New Consensus Statement on Inpatient Glycemic Control (2013)  Target Ranges:  Prepandial:   less than 140 mg/dL      Peak postprandial:   less than 180 mg/dL (1-2 hours)      Critically ill patients:  140 - 180 mg/dL  Hyperglycemia in 161 average range on tube feeds. Please consider addition of Novolog tube feed coverage of 4 units q 4 hrs to cover tube feeds. Pt getting correction resistant correction.  Use TF coverage q 4 hrs and decrease correction to moderate q 4 hrs. Thank you, Lenor Coffin, RN, Diabetes CNS [phone])

## 2012-11-22 NOTE — Progress Notes (Signed)
Stroke Team Progress Note  HISTORY Frederick [name] is a 72 y.o. male with a history of seizure disorder, atrial fibrillation, myasthenia gravis, sick sinus syndrome status post pacemaker, diabetes who was last seen normal definitively at 12 PM 11/14/2012.The friend saw him at that time, and then when outside in the sitting in the front porch.  At 6 PM, the friend her the patient cry out and went into see him. The patient stated that something was very wrong, and was slumped over to the left. The patient stated that the friend needed to call 911 and therefore this was done.  On arrival to the ED, the patient was not reliably following commands, had a blown pupil with an hour the deviated on the right and his responses were unintelligible. 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  Given this finding, he was taken for interventional radiology to try and reestablishes posterior circulation. Given that was posterior circulation, an extended window for intervention was used.   Patient was not a TPA candidate secondary to xarelto use, delay in arrival. He was admitted to the neuro ICU post intervention for further evaluation and treatment.  SUBJECTIVE Patient just back from PEG placement. Nurse at bedside  OBJECTIVE Most recent Vital Signs: Filed Vitals:   11/22/12 1125 11/22/12 1130 11/22/12 1145 11/22/12 1146  BP: 154/96 136/73 144/70 144/70  Pulse: 122 113  66  Temp:      TempSrc:      Resp: 16 21  16   Height:      Weight:      SpO2: 97% 97% 96% 96%   CBG (last 3)   Recent Labs  11/22/12 0003 11/22/12 0415 11/22/12 0817  GLUCAP 265* 217* 175*    IV Fluid Intake:   . feeding supplement (PROMOTE) 1,000 mL (11/21/12 1828)    MEDICATIONS  . antiseptic oral rinse  15 mL Mouth Rinse QID  . aspirin  325 mg Per Tube Daily  . atorvastatin  20 mg Per Tube q1800  .  carbamazepine  200 mg Per Tube Q12H  . chlorhexidine  15 mL Mouth Rinse BID  . diltiazem  60 mg Per Tube Q6H  . enoxaparin (LOVENOX) injection  40 mg Subcutaneous Q24H  . feeding supplement  30 mL Per Tube QID  . insulin aspart  0-20 Units Subcutaneous Q4H  . lipase/protease/amylase  2 capsule Oral TID AC  . pneumococcal 23 valent vaccine  0.5 mL Intramuscular Tomorrow-1000  . pyridostigmine  30 mg Oral Q4H   PRN:  acetaminophen (TYLENOL) oral liquid 160 mg/5 mL, hydrALAZINE, metoprolol, ondansetron (ZOFRAN) IV  Diet:  NPO  Activity:   DVT Prophylaxis:  SCDs   CLINICALLY SIGNIFICANT STUDIES Basic Metabolic Panel:   Recent Labs Lab 11/18/12 0525 11/20/12 0420  NA 144 143  K 4.3 4.2  CL 112 108  CO2 22 28  GLUCOSE 225* 168*  BUN 22 29*  CREATININE 1.09 0.97  CALCIUM 8.4 8.6   Liver Function Tests:   Recent Labs Lab 11/18/12 0525  AST 18  ALT 8  ALKPHOS 90  BILITOT 0.7  PROT 6.0  ALBUMIN 2.9*   CBC:   Recent Labs Lab 11/18/12 0525 11/20/12 0420  WBC 6.0 5.4  HGB 11.9* 11.3*  HCT 36.1* 33.7*  MCV 90.9 88.9  PLT 90* 126*   Coagulation:   Recent Labs Lab 11/19/12 0350  LABPROT 14.4  INR 1.14   Cardiac Enzymes:  No results found for this basename: CKTOTAL, CKMB, CKMBINDEX, TROPONINI,  in the last 168 hours Urinalysis: No results found for this basename: COLORURINE, APPERANCEUR, LABSPEC, PHURINE, GLUCOSEU, HGBUR, BILIRUBINUR, KETONESUR, PROTEINUR, UROBILINOGEN, NITRITE, LEUKOCYTESUR,  in the last 168 hours Lipid Panel    Component Value Date/Time   CHOL 100 11/15/2012 0611   TRIG 205* 11/15/2012 0611   HDL 20* 11/15/2012 0611   CHOLHDL 5.0 11/15/2012 0611   VLDL 41* 11/15/2012 0611   LDLCALC 39 11/15/2012 0611   HgbA1C  Lab Results  Component Value Date   HGBA1C 6.3* 11/15/2012    Urine Drug Screen:   No results found for this basename: labopia,  cocainscrnur,  labbenz,  amphetmu,  thcu,  labbarb    Alcohol Level: No results found for this basename: ETH,   in the last 168 hours  CT of the brain   11/16/2012    The low densities related to posterior circulation infarctions are becoming more noticeable, but not apparently enlarging. There is no hemorrhage or significant mass effect/shift. The areas of involvement include the cerebellum (more extensive on the left than the right, the mid brain and thalami (more extensive on the right than the left) in the left occipital cortical and subcortical region.   11/15/2012    No interval change or complicating features following angiography.    11/14/2012    No acute finding.  Chronic microvascular ischemic change.   CT Angio Head 11/14/2012   Question partially occlusive thrombus within the tip of the basilar artery.    CT Angio Neck 11/14/2012    1.  No CT evidence of dissection, high-grade stenosis, or aneurysm in the neck.  2. Atelectasis and /or consolidation within the partially visualized lungs, incompletely evaluated.      Cerebral angio S/P bilateral vert artery and bilateral common carotid aretry angiograms ,followed by partial angiographic recanalization of both PCAs and basilar artery occusion using 2 passes each with trevoprovue and Solitaire devices and 9.6 mg of Integrelin IA   MRI of the brain  CANNOT HAVE-HAS PPM NOT MRI COMPATIBLE  MRA of the brain  See angio  2D Echocardiogram  Ef 45-50% Diffuse HK  Carotid Doppler  See angio  CXR   11/19/2012 1. No change in aeration to the left lung base.  2. Satisfactory position of the tracheostomy tube. 11/17/2012 1. Slightly improved right basilar opacity may reflect resolving  atelectasis. 2. Persistent dense left basilar opacity consistent with  atelectasis versus infiltrate. Difficult to exclude a small left pleural effusion.  3. Stable and satisfactory support apparatus. 11/16/2012 1. Support apparatus. 2. Worsening bibasilar (left greater than right) atelectasis and/or consolidation, with superimposed small left and trace right- sided pleural effusions.  3.  Mild cardiomegaly. 4. Atherosclerosis. 11/15/2012    1.  Support apparatus in good position. 2.  Cardiomegaly with diffuse interstitial prominence suggesting interstitial pulmonary edema. 3.  Basilar atelectasis.   EKG  atrial fibrillation, rate 101.   Therapy Recommendations   Physical Exam   Elderly male intubated not in distress. . Afebrile. Head is nontraumatic. Neck is supple without bruit.  . Cardiac exam no murmur or gallop. Lungs are clear to auscultation. Distal pulses are well felt. Neurological Exam :  stuporose arouses minimally to sternal rub. Eyes deviated downwards with vertical skew.. Pupils unequal with left 5 mm and right 2 mm and unreactive.. corneals preserved. Fundi not visiualized. Cough and gag weak. Mild bifacial weakness .  Moves right side purposefully and localizes and now moves left side semipurposefully to touch.. Tone is normal..Plantars upgoing bilaterally.  ASSESSMENT Frederick [name] is a 72 y.o. male presenting with after slumping to the left - upon arrival he  was not reliably following commands, had a blown pupil within an hour ,the eyes deviated on the right and his responses were unintelligible. 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 Dr. Corliss Skains performed a partial recanalization of bilateral PCAs and basilar artery occlusion using mechanical embolectomy with solitaire device as well as IA Integrilin. Imaging confirms developing PCA infarcts; brainstem infarcts seen on subsequent CT. Infarcts felt to be due to top of the basilar syndrome embolic secondary to atrial fibrillation.  On xarelto prior to admission. Now on aspirin 325 mg orally every day for secondary stroke prevention. 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 Work up completed.   Atrial fibrillation, on xarelto prior to admission Diabetes, HgbA1c  pending, goal < 7.0 Seizure d/o. Home meds resumed Hx myasthenia gravis restless leg syndrome Hyperlipidemia, LDL 39, on lipitor 20 PTA, now on lipitor 20, goal LDL < 100 (< 70 for diabetics) Hypokalemia CAD SSS with pacer Thrombocytopenia, platelets 126  Hospital day # 8  TREATMENT/PLAN  Continue ASA 325 for secondary stroke prevention. Consider resuming his xarelto once able to use PEG. Will discuss with Dr. Pearlean Brownie in a.m.  No further stroke workup indicated  Will need SNF placement  [facility] BIBY, MSN, RN, ANVP-BC, ANP-BC, GNP-BC [name] Pager: [phone] 11/22/2012 12:18 PM  I have personally obtained a history, examined the patient, evaluated imaging results, and formulated the assessment and plan of care. I agree with the above.   Delia Heady, MD

## 2012-11-22 NOTE — Progress Notes (Signed)
I have read and agree with below cancel. Ivonne Andrew PT, DPT Pager: [phone]

## 2012-11-23 ENCOUNTER — Ambulatory Visit (HOSPITAL_COMMUNITY)
Admission: RE | Admit: 2012-11-23 | Discharge: 2012-11-23 | Disposition: A | Discharge status: Home / Self Care | Payer: Medicare Other | Source: Ambulatory Visit | Attending: Internal Medicine | Admitting: Internal Medicine

## 2012-11-23 LAB — GLUCOSE, CAPILLARY
Glucose-Capillary: 150 mg/dL — ABNORMAL HIGH (ref 70–99)
Glucose-Capillary: 155 mg/dL — ABNORMAL HIGH (ref 70–99)

## 2012-11-23 MED ORDER — RIVAROXABAN 20 MG PO TABS
20.0000 mg | ORAL_TABLET | Freq: Every day | ORAL | Status: DC
Start: 2012-11-23 — End: 2012-11-23
  Administered 2012-11-23: 20 mg via ORAL
  Filled 2012-11-23: qty 1

## 2012-11-23 MED ORDER — PRO-STAT SUGAR FREE PO LIQD: 30.0000 mL | Freq: Four times a day (QID) | ORAL | Status: AC

## 2012-11-23 MED ORDER — RIVAROXABAN 20 MG PO TABS: 20.0000 mg | ORAL_TABLET | Freq: Every day | ORAL | Status: AC

## 2012-11-23 MED ORDER — INSULIN ASPART 100 UNIT/ML ~~LOC~~ SOLN: 0.0000 [IU] | SUBCUTANEOUS | Status: AC

## 2012-11-23 MED ORDER — PROMOTE PO LIQD: 1000.0000 mL | ORAL | Status: AC

## 2012-11-23 MED ORDER — CARBAMAZEPINE 200 MG PO TABS: 200.0000 mg | ORAL_TABLET | Freq: Two times a day (BID) | ORAL | Status: AC

## 2012-11-23 MED ORDER — PYRIDOSTIGMINE BROMIDE 60 MG PO TABS: 30.0000 mg | ORAL_TABLET | ORAL | Status: AC

## 2012-11-23 MED ORDER — ASPIRIN 81 MG PO CHEW: 81.0000 mg | CHEWABLE_TABLET | Freq: Every day | ORAL | Status: AC

## 2012-11-23 MED ORDER — DILTIAZEM 12 MG/ML ORAL SUSPENSION: 60.0000 mg | Freq: Four times a day (QID) | ORAL | Status: AC

## 2012-11-23 MED ORDER — ASPIRIN 81 MG PO CHEW: 81.0000 mg | CHEWABLE_TABLET | Freq: Every day | ORAL | Status: DC

## 2012-11-23 NOTE — Progress Notes (Addendum)
Pt to be transferred, waiting availability of staff, RT in currently suctioning pt, spoke with unit charge nurse and will assist in transferring pt over to [facility], room 5738.   Pt not appropriate for stroke education.

## 2012-11-23 NOTE — Progress Notes (Signed)
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  Remained on vent post-procedure and PCCM consulted.   SIGNIFICANT EVENTS: 7/01 vertebral/carotid angiogram >> recanalization of b/l PCA and basilar arteries 7/02 Dilated Lt pupil 7/06 trach  STUDIES:  7/01 CT head >> no acute findings 7/01 CT angio head/neck >> partial occlusive thrombus of basilar artery 7/02 Echo >> EF 45 to 50%, mild LVH, mod LA dilation 7/02 CT head >> developing posterior circulation infarctions 7/03 CT head >> The low densities related to posterior circulation infarctions are becoming more noticeable, but not apparently enlarging.   LINES / TUBES: OETT 7/1 >>>7/6 Trach 7/6>>> R fem art sheath 7/1 >>> 7/02 R rad art line 7/1 >>> 7/03  ANTIBIOTICS: Ancef 7/01 >> off  INTERVAL HISTORY: Low grade fever  Remains on 35% ATC -  did not tolerate attempts to wean to 28%  VITAL SIGNS: Temp:  [98 F (36.7 C)-100.1 F (37.8 C)] 100.1 F (37.8 C) (07/10 1010) Pulse Rate:  [66-122] 88 (07/10 1010) Resp:  [16-22] 20 (07/10 1010) BP: (100-154)/(44-96) 100/60 mmHg (07/10 1010) SpO2:  [82 %-97 %] 94 % (07/10 1010) FiO2 (%):  [28 %-35 %] 35 % (07/10 1010) VENTILATOR SETTINGS: Vent Mode:  [-]  FiO2 (%):  [28 %-35 %] 35 %  INTAKE / OUTPUT: Intake/Output     07/09 0701 - 07/10 0700 07/10 0701 - 07/11 0700   P.O.     I.V. (mL/kg)     Other 180    NG/GT     Total Intake(mL/kg) 180 (1.8)    Urine (mL/kg/hr) 2275 (1) 600 (1.5)   Total Output 2275 600   Net -2095 -600         PHYSICAL EXAMINATION: General:  No distress on trach collar  Neuro: Does not follow commands HEENT: Lt pupil  dilated, disconjugate gaze  Cardiovascular:  No M/R/G Lungs: Scattered rhonchi Abdomen:  Soft, nontender, bowel sounds diminished Musculoskeletal:  1+ upper extremity edema Skin:  Intact  LABS: CBC No results found for this basename: WBC, HGB, HCT, PLT,  in the last 72 hours Coag's No results found for this basename: APTT, INR,  in the last 72 hours  BMET No results found for this basename: NA, K, CL, CO2, BUN, CREATININE, GLUCOSE,  in the last 72 hours  Electrolytes No results found for this basename: CALCIUM, MG, PHOS,  in the last 72 hours ABG No results found for this basename: PHART, PCO2ART, PO2ART,  in the last 72 hours Glucose Recent Labs     11/22/12  1212  11/22/12  1640  11/22/12  2103  11/23/12  0033  11/23/12  0407  11/23/12  0814  GLUCAP  202*  171*  190*  155*  150*  174*    Imaging No new CXR  ASSESSMENT / PLAN: NEUROLOGIC A:  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 Hx of seizures myasthenia gravis, RLS. P:   -continue ASA -cont hold requip, ambien -tegretol for seizures per neurology -mestinon per neurology  PULMONARY A:  Acute respiratory failure due to inability to protect airway.      RLL ATX      S/p trach  7/6 P:   -ATC as toelrated -cont attempts to wean O2  CARDIOVASCULAR A:  Hx of A fib hyperlipidemia CAD SSS s/p PM >> followed by [facility] cardiology as outpt P:  -continue lipitor 20 -cardizem -resume xarelto - ok with neuro , tegretol makes this less effective per pharmacy  RENAL A: Acute kidney injury >>resolved P:   -KVO IV fluids -d/c daily labs -intermittent f/u chem   GASTROINTESTINAL A:  Nutrition. P:   -toelrating TF via G tube  -protonix for SUP  HEMATOLOGIC A:  Mild anemia thrombocytopenia. P:  -d/c daily cbcs  INFECTIOUS A: low grade fever  P:   -monitor clinically -check cbc 7/11  ENDOCRINE  A:  DM type II. P:   -SSI  Plan likely for LTAC at d/c, case management waiting for  decisions from family regarding [facility].   Danford Bad, NP 11/23/2012  10:58 AM Pager: [phone] or [phone]  *Care during the described time interval was provided by me and/or other providers on the critical care team. I have reviewed this patient's available data, including medical history, events of note, physical examination and test results as part of my evaluation.   ALVA,RAKESH V.

## 2012-11-23 NOTE — Progress Notes (Signed)
Stroke Team Progress Note  HISTORY Frederick [name] is a 72 y.o. male with a history of seizure disorder, atrial fibrillation, myasthenia gravis, sick sinus syndrome status post pacemaker, diabetes who was last seen normal definitively at 12 PM 11/14/2012.The friend saw him at that time, and then when outside in the sitting in the front porch.  At 6 PM, the friend her the patient cry out and went into see him. The patient stated that something was very wrong, and was slumped over to the left. The patient stated that the friend needed to call 911 and therefore this was done.  On arrival to the ED, the patient was not reliably following commands, had a blown pupil with an hour the deviated on the right and his responses were unintelligible. 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  Given this finding, he was taken for interventional radiology to try and reestablishes posterior circulation. Given that was posterior circulation, an extended window for intervention was used.   Patient was not a TPA candidate secondary to xarelto use, delay in arrival. He was admitted to the neuro ICU post intervention for further evaluation and treatment.  SUBJECTIVE Nurse now using PEG  OBJECTIVE Most recent Vital Signs: Filed Vitals:   11/23/12 0841 11/23/12 1010 11/23/12 1300 11/23/12 1538  BP:  100/60  124/53  Pulse: 87 88 95 89  Temp:  100.1 F (37.8 C)  98.8 F (37.1 C)  TempSrc:  Axillary  Axillary  Resp: 20 20 20 20   Height:      Weight:      SpO2: 94% 94% 94% 94%   CBG (last 3)   Recent Labs  11/23/12 0814 11/23/12 1204 11/23/12 1716  GLUCAP 174* 187* 184*    IV Fluid Intake:   . feeding supplement (PROMOTE) 1,000 mL (11/21/12 1828)    MEDICATIONS  . antiseptic oral rinse  15 mL Mouth Rinse QID  . [START ON 11/24/2012] aspirin  81 mg Per Tube Daily  . atorvastatin  20 mg  Per Tube q1800  . carbamazepine  200 mg Per Tube Q12H  . chlorhexidine  15 mL Mouth Rinse BID  . diltiazem  60 mg Per Tube Q6H  . feeding supplement  30 mL Per Tube QID  . insulin aspart  0-20 Units Subcutaneous Q4H  . lipase/protease/amylase  2 capsule Oral TID AC  . pyridostigmine  30 mg Oral Q4H  . rivaroxaban  20 mg Oral Q supper   PRN:  acetaminophen (TYLENOL) oral liquid 160 mg/5 mL, hydrALAZINE, metoprolol, ondansetron (ZOFRAN) IV  Diet:  NPO  Activity:   DVT Prophylaxis:  SCDs   CLINICALLY SIGNIFICANT STUDIES Basic Metabolic Panel:   Recent Labs Lab 11/18/12 0525 11/20/12 0420  NA 144 143  K 4.3 4.2  CL 112 108  CO2 22 28  GLUCOSE 225* 168*  BUN 22 29*  CREATININE 1.09 0.97  CALCIUM 8.4 8.6   Liver Function Tests:   Recent Labs Lab 11/18/12 0525  AST 18  ALT 8  ALKPHOS 90  BILITOT 0.7  PROT 6.0  ALBUMIN 2.9*   CBC:   Recent Labs Lab 11/18/12 0525 11/20/12 0420  WBC 6.0 5.4  HGB 11.9* 11.3*  HCT 36.1* 33.7*  MCV 90.9 88.9  PLT 90* 126*   Coagulation:   Recent Labs Lab 11/19/12 0350  LABPROT 14.4  INR 1.14   Cardiac  Enzymes:  No results found for this basename: CKTOTAL, CKMB, CKMBINDEX, TROPONINI,  in the last 168 hours Urinalysis: No results found for this basename: COLORURINE, APPERANCEUR, LABSPEC, PHURINE, GLUCOSEU, HGBUR, BILIRUBINUR, KETONESUR, PROTEINUR, UROBILINOGEN, NITRITE, LEUKOCYTESUR,  in the last 168 hours Lipid Panel    Component Value Date/Time   CHOL 100 11/15/2012 0611   TRIG 205* 11/15/2012 0611   HDL 20* 11/15/2012 0611   CHOLHDL 5.0 11/15/2012 0611   VLDL 41* 11/15/2012 0611   LDLCALC 39 11/15/2012 0611   HgbA1C  Lab Results  Component Value Date   HGBA1C 6.3* 11/15/2012    Urine Drug Screen:   No results found for this basename: labopia,  cocainscrnur,  labbenz,  amphetmu,  thcu,  labbarb    Alcohol Level: No results found for this basename: ETH,  in the last 168 hours  CT of the brain   11/16/2012    The low densities  related to posterior circulation infarctions are becoming more noticeable, but not apparently enlarging. There is no hemorrhage or significant mass effect/shift. The areas of involvement include the cerebellum (more extensive on the left than the right, the mid brain and thalami (more extensive on the right than the left) in the left occipital cortical and subcortical region.   11/15/2012    No interval change or complicating features following angiography.    11/14/2012    No acute finding.  Chronic microvascular ischemic change.   CT Angio Head 11/14/2012   Question partially occlusive thrombus within the tip of the basilar artery.    CT Angio Neck 11/14/2012    1.  No CT evidence of dissection, high-grade stenosis, or aneurysm in the neck.  2. Atelectasis and /or consolidation within the partially visualized lungs, incompletely evaluated.      Cerebral angio S/P bilateral vert artery and bilateral common carotid aretry angiograms ,followed by partial angiographic recanalization of both PCAs and basilar artery occusion using 2 passes each with trevoprovue and Solitaire devices and 9.6 mg of Integrelin IA   MRI of the brain  CANNOT HAVE-HAS PPM NOT MRI COMPATIBLE  MRA of the brain  See angio  2D Echocardiogram  Ef 45-50% Diffuse HK  Carotid Doppler  See angio  CXR   11/19/2012 1. No change in aeration to the left lung base.  2. Satisfactory position of the tracheostomy tube. 11/17/2012 1. Slightly improved right basilar opacity may reflect resolving  atelectasis. 2. Persistent dense left basilar opacity consistent with  atelectasis versus infiltrate. Difficult to exclude a small left pleural effusion.  3. Stable and satisfactory support apparatus. 11/16/2012 1. Support apparatus. 2. Worsening bibasilar (left greater than right) atelectasis and/or consolidation, with superimposed small left and trace right- sided pleural effusions.  3. Mild cardiomegaly. 4. Atherosclerosis. 11/15/2012    1.  Support apparatus  in good position. 2.  Cardiomegaly with diffuse interstitial prominence suggesting interstitial pulmonary edema. 3.  Basilar atelectasis.   EKG  atrial fibrillation, rate 101.   Therapy Recommendations   Physical Exam   Elderly male intubated not in distress. . Afebrile. Head is nontraumatic. Neck is supple without bruit.  . Cardiac exam no murmur or gallop. Lungs are clear to auscultation. Distal pulses are well felt. Neurological Exam :  stuporose arouses minimally to sternal rub. Eyes deviated downwards with vertical skew.. Pupils unequal with left 5 mm and right 2 mm and unreactive.. corneals preserved. Fundi not visiualized. Cough and gag weak. Mild bifacial weakness . Moves right side purposefully and localizes and  now moves left side semipurposefully to touch.. Tone is normal..Plantars upgoing bilaterally.  ASSESSMENT [name] is a 72 y.o. male presenting with after slumping to the left - upon arrival he  was not reliably following commands, had a blown pupil within an hour ,the eyes deviated on the right and his responses were unintelligible. 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 Dr. Corliss Skains performed a partial recanalization of bilateral PCAs and basilar artery occlusion using mechanical embolectomy with solitaire device as well as IA Integrilin. Imaging confirms developing PCA infarcts; brainstem infarcts seen on subsequent CT. Infarcts felt to be due to top of the basilar syndrome embolic secondary to atrial fibrillation.  On xarelto prior to admission. Now on aspirin 81 mg orally every day for secondary stroke prevention. 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 Work up completed.   Atrial fibrillation, on xarelto prior to admission Diabetes, HgbA1c pending, goal < 7.0 Seizure d/o. Home meds resumed Hx  myasthenia gravis restless leg syndrome Hyperlipidemia, LDL 39, on lipitor 20 PTA, now on lipitor 20, goal LDL < 100 (< 70 for diabetics) Hypokalemia CAD SSS with pacer Thrombocytopenia, platelets 126  Hospital day # 9  TREATMENT/PLAN  Resume xarelto (Tegretol will make it last effective per pharmacy). No need for additional aspirin from the neuro standpoint  No further stroke workup indicated  Will need LTAC at discharge per CCM No further stroke workup indicated. Patient has a 10-15% risk of having another stroke over the next year, the highest risk is within 2 weeks of the most recent stroke/TIA (risk of having a stroke following a stroke or TIA is the same). Ongoing risk factor control by Primary Care Physician Stroke Service will sign off. Please call should any needs arise. Follow up with Dr. Pearlean Brownie, Stroke Clinic, in 2 months.   Annie Main, MSN, RN, ANVP-BC, ANP-BC, [facility] Pager: [phone] 11/23/2012 5:41 PM  I have personally obtained a history, examined the patient, evaluated imaging results, and formulated the assessment and plan of care. I agree with the above.  Delia Heady, MD

## 2012-11-23 NOTE — Progress Notes (Signed)
Attempted to call report to [facility], nurse not available at this time. Will return call.

## 2012-11-23 NOTE — Progress Notes (Addendum)
eLink Physician-Brief Progress Note Patient Name: Frederick [name] DOB: 04-15-1941 MRN: [id number]  Date of Service  11/23/2012   HPI/Events of Note   [facility] unable to [facility] to [facility]multiple issues from [facility]  I spoke to doc , rejected pt to come  eICU Interventions  Keep in cone re eval inam   Update: Issues have been solved. Pt can be safe transfer to [facility]      Nelda Bucks. 11/23/2012, 9:50 PM

## 2012-11-23 NOTE — Discharge Summary (Signed)
Physician Discharge Summary  Patient ID: Frederick [name] MRN: [id number] DOB/AGE: [birth year] 72 y.o.  Admit date: 11/14/2012 Discharge date: 11/23/2012    Discharge Diagnoses:  Principal Problem:   CVA (cerebral infarction) Active Problems:   DYSLIPIDEMIA   Essential hypertension, benign   CORONARY ATHEROSCLEROSIS NATIVE CORONARY ARTERY   Atrial fibrillation   DM (diabetes mellitus), type 2 with neurological complications   Acute respiratory failure   Tracheostomy status    Brief Summary: Frederick [name] is a 72 yo male with hx Afib, DM, myasthenia and seizures admitted 7/1 with seizure and found to have non-occlusive basilar thrombosis, s/p neuro IR. Remained on vent post-procedure and PCCM assumed care.   SIGNIFICANT EVENTS:  7/01 vertebral/carotid angiogram >> recanalization of b/l PCA and basilar arteries  7/02 Dilated Lt pupil  7/06 trach   STUDIES:  7/01 CT head >> no acute findings  7/01 CT angio head/neck >> partial occlusive thrombus of basilar artery  7/02 Echo >> EF 45 to 50%, mild LVH, mod LA dilation  7/02 CT head >> developing posterior circulation infarctions  7/03 CT head >> The low densities related to posterior circulation infarctions are becoming more noticeable, but not apparently enlarging.   LINES / TUBES:  OETT 7/1 >>>7/6  Trach 7/6>>>  R fem art sheath 7/1 >>> 7/02  R rad art line 7/1 >>> 7/03   ANTIBIOTICS:  Ancef 7/01 >> off                                                                   Hospital Summary by Discharge Diagnosis  Acute CVA 2nd to thrombus of posterior circulation Hx Seizures- s/p IR procedure for revascularization.  Followed clsoely by Neuro.  No TPA r/t delay in arrival and xarelto use. Patial recanalization of bilat PCA's in IR and IR clot retrieval. Infarct felt to be r/t atrial fibrillation. Still with global aphasia, dysphagia, hemiparesis L.  Cont ASA, tegretol, mestinon.   Acute on chronic renal failure - resolved.   Diuretics, BP meds held.  Fluid resuscitation.  F/u chem.    Acute respiratory failure - s/p trach - r/t inability to protect airway in setting large CVA. Now tol ATC at all times.  35%ATC. No consideration decannulation at this time.  Further speech, PT per [facility]. Intermittent f/u CXR,.   Afib Hyperlipidemia   - will resume xarelto as prior to admit - ok with neuro.   Dysphagia - pt s/p GTube.  Cont ST efforts on [facility].   Low grade fever -- No obvious source infection.  Check CBC in am 7/11.  Cont off abx.    Filed Vitals:   11/23/12 0537 11/23/12 0841 11/23/12 1010 11/23/12 1300  BP: 107/69  100/60   Pulse: 106 87 88 95  Temp: 99 F (37.2 C)  100.1 F (37.8 C)   TempSrc: Axillary  Axillary   Resp: 20 20 20 20   Height:      Weight:      SpO2: 96% 94% 94% 94%     Discharge Labs  BMET  Recent Labs Lab 11/17/12 1000 11/18/12 0525 11/20/12 0420  NA 143 144 143  K 3.5 4.3 4.2  CL 112 112 108  CO2 23 22 28   GLUCOSE 216*  225* 168*  BUN 16 22 29*  CREATININE 1.08 1.09 0.97  CALCIUM 8.1* 8.4 8.6     CBC   Recent Labs Lab 11/17/12 1000 11/18/12 0525 11/20/12 0420  HGB 11.0* 11.9* 11.3*  HCT 32.8* 36.1* 33.7*  WBC 4.9 6.0 5.4  PLT 116* 90* 126*   Anti-Coagulation  Recent Labs Lab 11/19/12 0350  INR 1.14      Discharge Orders   Future Appointments Provider Department Dept Phone   01/01/2013 11:30 AM [facility] Main Office [place]) [phone]   Future Orders Complete By Expires     Diet general  As directed     Scheduling Instructions:      Tube feeding per Gtube            Follow-up Information   Follow up with [facility], MD.         Medication List    STOP taking these medications       benazepril 5 MG tablet  Commonly known as:  LOTENSIN     diltiazem 360 MG 24 hr capsule  Commonly known as:  CARDIZEM CD     fluticasone 50 MCG/ACT nasal spray  Commonly known as:  FLONASE      furosemide 40 MG tablet  Commonly known as:  LASIX     glipiZIDE 5 MG 24 hr tablet  Commonly known as:  GLUCOTROL XL     insulin glargine 100 UNIT/ML injection  Commonly known as:  LANTUS     pyridostigmine 180 MG CR tablet  Commonly known as:  MESTINON  Replaced by:  pyridostigmine 60 MG tablet     rOPINIRole 1 MG tablet  Commonly known as:  REQUIP     zolpidem 10 MG tablet  Commonly known as:  AMBIEN      TAKE these medications       aspirin 81 MG chewable tablet  Place 1 tablet (81 mg total) into feeding tube daily.  Start taking on:  11/24/2012     atorvastatin 20 MG tablet  Commonly known as:  LIPITOR  Take 20 mg by mouth daily.     carbamazepine 200 MG tablet  Commonly known as:  TEGRETOL  Place 1 tablet (200 mg total) into feeding tube every 12 (twelve) hours.     CREON 24000 UNITS Cpep  Generic drug:  Pancrelipase (Lip-Prot-Amyl)  Take 2 capsules by mouth 3 (three) times daily before meals.     diltiazem 10 mg/ml oral suspension  Commonly known as:  CARDIZEM  Place 6 mLs (60 mg total) into feeding tube every 6 (six) hours.     feeding supplement (PROMOTE) Liqd  Place 1,000 mLs into feeding tube continuous.     feeding supplement Liqd  Place 30 mLs into feeding tube 4 (four) times daily.     insulin aspart 100 UNIT/ML injection  Commonly known as:  novoLOG  Inject 0-20 Units into the skin every 4 (four) hours.     pyridostigmine 60 MG tablet  Commonly known as:  MESTINON  Take 0.5 tablets (30 mg total) by mouth every 4 (four) hours.     Rivaroxaban 20 MG Tabs  Commonly known as:  XARELTO  Take 1 tablet (20 mg total) by mouth daily with supper.          Disposition: LTAC   Discharged Condition: Frederick [name] has met maximum benefit of inpatient care and is medically stable and cleared for discharge.  Patient is pending follow  up as above.      Time spent on disposition:  Greater than 35 minutes.   SignedDanford Bad,  NP 11/23/2012  2:33 PM Pager: [phone] or [phone]  *Care during the described time interval was provided by me and/or other providers on the critical care team. I have reviewed this patient's available data, including medical history, events of note, physical examination and test results as part of my evaluation.  Fillmore Bynum V.

## 2012-11-23 NOTE — Progress Notes (Signed)
Report given to nurse Marquis Lunch. Pt will transfer pt after urostomy pouch arrives and applied as she was informed of this need due to pouch leaking.

## 2012-11-23 NOTE — Progress Notes (Signed)
9 Days Post-Op  Subjective: Patient had G-tube placed 7/9 has been using for meds with flushing.   Objective: Vital signs in last 24 hours: Temp:  [98 F (36.7 C)-100 F (37.8 C)] 99 F (37.2 C) (07/10 0537) Pulse Rate:  [66-122] 87 (07/10 0841) Resp:  [16-28] 20 (07/10 0841) BP: (107-164)/(44-97) 107/69 mmHg (07/10 0537) SpO2:  [82 %-99 %] 94 % (07/10 0841) FiO2 (%):  [28 %-35 %] 35 % (07/10 0841) Last BM Date: 11/17/12  Intake/Output from previous day: 07/09 0701 - 07/10 0700 In: 180  Out: 2275 [Urine:2275] Intake/Output this shift: Total I/O In: -  Out: 600 [Urine:600]  PE: Temp 99 G-tube intact without bleeding or redness around site.  BS (+) Abdomen soft.  Lab Results:  No results found for this basename: WBC, HGB, HCT, PLT,  in the last 72 hours BMET No results found for this basename: NA, K, CL, CO2, GLUCOSE, BUN, CREATININE, CALCIUM,  in the last 72 hours PT/INR No results found for this basename: LABPROT, INR,  in the last 72 hours ABG No results found for this basename: PHART, PCO2, PO2, HCO3,  in the last 72 hours  Studies/Results: Ir Gastrostomy Tube Mod Sed  11/22/2012   *RADIOLOGY REPORT*  Clinical history:72 year old with CVA and needs nutritional support.  PROCEDURE(S): PERCUTANEOUS GASTROSTOMY TUBE WITH FLUOROSCOPIC GUIDANCE  Physician: Rachelle Hora. Lowella Dandy, MD  Medications:Versed 0.5mg , Fentanyl 25 mcg, Ancef 2 gram. A radiology nurse monitored the patient for moderate sedation. As antibiotic prophylaxis, Ancef  was ordered pre-procedure and administered intravenously within one hour of incision.  Moderate sedation time:15 minutes  Fluoroscopy time:  5 minutes and 54 seconds  Procedure:Informed consent was obtained for a percutaneous gastrostomy tube. Consent was obtained from the patient's son over the telephone.  The patient was placed on the interventional table. Fluoroscopy demonstrated oral contrast in the transverse colon.  An orogastric tube was placed with  fluoroscopic guidance.  The anterior abdomen was prepped and draped in sterile fashion. Maximal barrier sterile technique was utilized including caps, mask, sterile gowns, sterile gloves, sterile drape, hand hygiene and skin antiseptic.  Stomach was inflated with air through the orogastric tube.  The skin and subcutaneous tissues were anesthetized with 1% lidocaine.  A 17 gauge needle was directed into the distended stomach with fluoroscopic guidance.  A wire was advanced into the stomach.  A 9-French vascular sheath was placed and the orogastric tube was snared using a Gooseneck snare device. The orogastric tube and snare were pulled out of the patient's mouth.  The snare device was connected to a 20-French gastrostomy tube.  The snare device and gastrostomy tube were pulled through the patient's mouth and out the anterior abdominal wall.  The gastrostomy tube was cut to an appropriate length.  Contrast injection through gastrostomy tube confirmed placement within the stomach.  Fluoroscopic images were obtained for documentation.  The gastrostomy tube was flushed with normal saline.  Findings:Gastrostomy tube within the stomach.  Complications: None  Impression:Successful fluoroscopic guided percutaneous gastrostomy tube placement.   Original Report Authenticated By: Richarda Overlie, M.D.     Assessment/Plan: S/p G-tube 7/9 flushing well and medications given through. May use now for feedings.   LOS: 9 days    Falisha Osment A 11/23/2012

## 2012-11-24 ENCOUNTER — Inpatient Hospital Stay
Admission: AD | Admit: 2012-11-24 | Discharge: 2013-01-03 | Disposition: A | Discharge status: Hospice | Payer: Self-pay | Source: Ambulatory Visit | Attending: Internal Medicine | Admitting: Internal Medicine

## 2012-11-24 ENCOUNTER — Other Ambulatory Visit (HOSPITAL_COMMUNITY): Payer: Self-pay

## 2012-11-24 LAB — CBC WITH DIFFERENTIAL/PLATELET
Basophils Absolute: 0 10*3/uL (ref 0.0–0.1)
Basophils Relative: 0 % (ref 0–1)
Eosinophils Relative: 1 % (ref 0–5)
HCT: 38.1 % — ABNORMAL LOW (ref 39.0–52.0)
MCHC: 32.5 g/dL (ref 30.0–36.0)
MCV: 90.7 fL (ref 78.0–100.0)
Monocytes Absolute: 1 10*3/uL (ref 0.1–1.0)
Platelets: 190 10*3/uL (ref 150–400)
RDW: 14 % (ref 11.5–15.5)

## 2012-11-24 LAB — URINE MICROSCOPIC-ADD ON

## 2012-11-24 LAB — URINALYSIS, ROUTINE W REFLEX MICROSCOPIC
Glucose, UA: NEGATIVE mg/dL
Specific Gravity, Urine: 1.019 (ref 1.005–1.030)

## 2012-11-24 LAB — COMPREHENSIVE METABOLIC PANEL
ALT: 39 U/L (ref 0–53)
Albumin: 2.5 g/dL — ABNORMAL LOW (ref 3.5–5.2)
Alkaline Phosphatase: 141 U/L — ABNORMAL HIGH (ref 39–117)
Calcium: 8.6 mg/dL (ref 8.4–10.5)
GFR calc Af Amer: 59 mL/min — ABNORMAL LOW (ref >90)
Glucose, Bld: 242 mg/dL — ABNORMAL HIGH (ref 70–99)
Potassium: 3.7 mEq/L (ref 3.5–5.1)
Sodium: 150 mEq/L — ABNORMAL HIGH (ref 135–145)
Total Protein: 6.6 g/dL (ref 6.0–8.3)

## 2012-11-24 LAB — T4, FREE: Free T4: 1.1 ng/dL (ref 0.80–1.80)

## 2012-11-24 LAB — HEPATIC FUNCTION PANEL
Bilirubin, Direct: 0.2 mg/dL (ref 0.0–0.3)
Indirect Bilirubin: 0.4 mg/dL (ref 0.3–0.9)
Total Protein: 6.6 g/dL (ref 6.0–8.3)

## 2012-11-24 LAB — PREALBUMIN: Prealbumin: 8.5 mg/dL — ABNORMAL LOW (ref 17.0–34.0)

## 2012-11-24 LAB — SEDIMENTATION RATE: Sed Rate: 72 mm/hr — ABNORMAL HIGH (ref 0–16)

## 2012-11-24 LAB — MAGNESIUM: Magnesium: 2.3 mg/dL (ref 1.5–2.5)

## 2012-11-24 LAB — TSH: TSH: 0.055 u[IU]/mL — ABNORMAL LOW (ref 0.350–4.500)

## 2012-11-25 ENCOUNTER — Other Ambulatory Visit (HOSPITAL_COMMUNITY): Payer: Self-pay

## 2012-11-25 LAB — CBC WITH DIFFERENTIAL/PLATELET
Basophils Relative: 0 % (ref 0–1)
Eosinophils Relative: 1 % (ref 0–5)
Lymphs Abs: 0.9 10*3/uL (ref 0.7–4.0)
MCH: 29 pg (ref 26.0–34.0)
MCHC: 31.8 g/dL (ref 30.0–36.0)
MCV: 91.1 fL (ref 78.0–100.0)
Monocytes Absolute: 0.7 10*3/uL (ref 0.1–1.0)
Platelets: 190 10*3/uL (ref 150–400)
RBC: 4.28 MIL/uL (ref 4.22–5.81)

## 2012-11-25 LAB — BASIC METABOLIC PANEL
BUN: 50 mg/dL — ABNORMAL HIGH (ref 6–23)
Calcium: 8.7 mg/dL (ref 8.4–10.5)
Chloride: 114 mEq/L — ABNORMAL HIGH (ref 96–112)
Creatinine, Ser: 1.38 mg/dL — ABNORMAL HIGH (ref 0.50–1.35)
GFR calc Af Amer: 57 mL/min — ABNORMAL LOW (ref >90)

## 2012-11-26 LAB — CBC WITH DIFFERENTIAL/PLATELET
Basophils Absolute: 0.1 10*3/uL (ref 0.0–0.1)
Basophils Relative: 1 % (ref 0–1)
Eosinophils Relative: 5 % (ref 0–5)
HCT: 37.3 % — ABNORMAL LOW (ref 39.0–52.0)
MCHC: 31.6 g/dL (ref 30.0–36.0)
MCV: 91 fL (ref 78.0–100.0)
Monocytes Absolute: 0.6 10*3/uL (ref 0.1–1.0)
RDW: 14.1 % (ref 11.5–15.5)

## 2012-11-26 LAB — BASIC METABOLIC PANEL
BUN: 56 mg/dL — ABNORMAL HIGH (ref 6–23)
CO2: 30 mEq/L (ref 19–32)
Chloride: 115 mEq/L — ABNORMAL HIGH (ref 96–112)
Creatinine, Ser: 1.24 mg/dL (ref 0.50–1.35)

## 2012-11-26 LAB — URINE CULTURE: Colony Count: >=100000

## 2012-11-27 LAB — CULTURE, RESPIRATORY W GRAM STAIN

## 2012-11-27 LAB — CBC WITH DIFFERENTIAL/PLATELET
Basophils Absolute: 0 10*3/uL (ref 0.0–0.1)
Basophils Relative: 0 % (ref 0–1)
MCHC: 31.5 g/dL (ref 30.0–36.0)
Monocytes Absolute: 0.5 10*3/uL (ref 0.1–1.0)
Neutro Abs: 5.9 10*3/uL (ref 1.7–7.7)
Neutrophils Relative %: 77 % (ref 43–77)
RDW: 13.9 % (ref 11.5–15.5)

## 2012-11-27 LAB — COMPREHENSIVE METABOLIC PANEL
AST: 57 U/L — ABNORMAL HIGH (ref 0–37)
Albumin: 2.3 g/dL — ABNORMAL LOW (ref 3.5–5.2)
Chloride: 117 mEq/L — ABNORMAL HIGH (ref 96–112)
Creatinine, Ser: 1.16 mg/dL (ref 0.50–1.35)
Total Bilirubin: 0.3 mg/dL (ref 0.3–1.2)
Total Protein: 6.2 g/dL (ref 6.0–8.3)

## 2012-11-27 LAB — FOLATE RBC: RBC Folate: 923 ng/mL — ABNORMAL HIGH (ref >=366)

## 2012-11-28 LAB — BASIC METABOLIC PANEL
CO2: 24 mEq/L (ref 19–32)
Calcium: 9.1 mg/dL (ref 8.4–10.5)
Chloride: 116 mEq/L — ABNORMAL HIGH (ref 96–112)
Glucose, Bld: 353 mg/dL — ABNORMAL HIGH (ref 70–99)
Potassium: 4.3 mEq/L (ref 3.5–5.1)
Sodium: 151 mEq/L — ABNORMAL HIGH (ref 135–145)

## 2012-11-28 LAB — PROTIME-INR: INR: 1.2 (ref 0.00–1.49)

## 2012-11-29 LAB — CBC WITH DIFFERENTIAL/PLATELET
HCT: 39.5 % (ref 39.0–52.0)
Hemoglobin: 12.7 g/dL — ABNORMAL LOW (ref 13.0–17.0)
Lymphocytes Relative: 13 % (ref 12–46)
Lymphs Abs: 1.1 10*3/uL (ref 0.7–4.0)
Monocytes Absolute: 0.5 10*3/uL (ref 0.1–1.0)
Monocytes Relative: 6 % (ref 3–12)
Neutro Abs: 6.4 10*3/uL (ref 1.7–7.7)
Neutrophils Relative %: 75 % (ref 43–77)
RBC: 4.37 MIL/uL (ref 4.22–5.81)
WBC: 8.5 10*3/uL (ref 4.0–10.5)

## 2012-11-29 LAB — BASIC METABOLIC PANEL
BUN: 49 mg/dL — ABNORMAL HIGH (ref 6–23)
Chloride: 117 mEq/L — ABNORMAL HIGH (ref 96–112)
GFR calc Af Amer: 71 mL/min — ABNORMAL LOW (ref >90)
Glucose, Bld: 365 mg/dL — ABNORMAL HIGH (ref 70–99)
Potassium: 4.3 mEq/L (ref 3.5–5.1)
Sodium: 155 mEq/L — ABNORMAL HIGH (ref 135–145)

## 2012-11-30 LAB — BASIC METABOLIC PANEL
BUN: 49 mg/dL — ABNORMAL HIGH (ref 6–23)
Chloride: 116 mEq/L — ABNORMAL HIGH (ref 96–112)
GFR calc Af Amer: 71 mL/min — ABNORMAL LOW (ref >90)
Glucose, Bld: 414 mg/dL — ABNORMAL HIGH (ref 70–99)
Potassium: 4.3 mEq/L (ref 3.5–5.1)
Sodium: 150 mEq/L — ABNORMAL HIGH (ref 135–145)

## 2012-11-30 LAB — CULTURE, BLOOD (ROUTINE X 2): Culture: NO GROWTH

## 2012-12-01 ENCOUNTER — Other Ambulatory Visit (HOSPITAL_COMMUNITY): Payer: Self-pay

## 2012-12-01 LAB — BASIC METABOLIC PANEL
BUN: 48 mg/dL — ABNORMAL HIGH (ref 6–23)
CO2: 26 mEq/L (ref 19–32)
Chloride: 114 mEq/L — ABNORMAL HIGH (ref 96–112)
GFR calc non Af Amer: 61 mL/min — ABNORMAL LOW (ref >90)
Glucose, Bld: 394 mg/dL — ABNORMAL HIGH (ref 70–99)
Potassium: 4.2 mEq/L (ref 3.5–5.1)
Sodium: 151 mEq/L — ABNORMAL HIGH (ref 135–145)

## 2012-12-01 LAB — CBC
HCT: 41.6 % (ref 39.0–52.0)
Hemoglobin: 13.5 g/dL (ref 13.0–17.0)
MCHC: 32.5 g/dL (ref 30.0–36.0)
RBC: 4.68 MIL/uL (ref 4.22–5.81)

## 2012-12-03 LAB — BASIC METABOLIC PANEL
BUN: 42 mg/dL — ABNORMAL HIGH (ref 6–23)
CO2: 27 mEq/L (ref 19–32)
Calcium: 8.9 mg/dL (ref 8.4–10.5)
Creatinine, Ser: 1.09 mg/dL (ref 0.50–1.35)
GFR calc non Af Amer: 65 mL/min — ABNORMAL LOW (ref >90)
Glucose, Bld: 288 mg/dL — ABNORMAL HIGH (ref 70–99)

## 2012-12-03 LAB — CBC
HCT: 40.3 % (ref 39.0–52.0)
Hemoglobin: 12.6 g/dL — ABNORMAL LOW (ref 13.0–17.0)
MCH: 27.9 pg (ref 26.0–34.0)
MCHC: 31.3 g/dL (ref 30.0–36.0)
MCV: 89.4 fL (ref 78.0–100.0)
RBC: 4.51 MIL/uL (ref 4.22–5.81)

## 2012-12-04 LAB — CBC
MCH: 28.6 pg (ref 26.0–34.0)
MCHC: 32.3 g/dL (ref 30.0–36.0)
MCV: 88.8 fL (ref 78.0–100.0)
Platelets: 276 10*3/uL (ref 150–400)
RBC: 4.54 MIL/uL (ref 4.22–5.81)

## 2012-12-04 LAB — BASIC METABOLIC PANEL
CO2: 27 mEq/L (ref 19–32)
Calcium: 8.9 mg/dL (ref 8.4–10.5)
Creatinine, Ser: 1.11 mg/dL (ref 0.50–1.35)
GFR calc non Af Amer: 63 mL/min — ABNORMAL LOW (ref >90)
Sodium: 147 mEq/L — ABNORMAL HIGH (ref 135–145)

## 2012-12-06 ENCOUNTER — Other Ambulatory Visit (HOSPITAL_COMMUNITY): Payer: Self-pay

## 2012-12-06 LAB — BASIC METABOLIC PANEL
Calcium: 9.2 mg/dL (ref 8.4–10.5)
Chloride: 113 mEq/L — ABNORMAL HIGH (ref 96–112)
Creatinine, Ser: 1.2 mg/dL (ref 0.50–1.35)
GFR calc Af Amer: 67 mL/min — ABNORMAL LOW (ref >90)
Sodium: 152 mEq/L — ABNORMAL HIGH (ref 135–145)

## 2012-12-06 LAB — HEMOGLOBIN A1C
Hgb A1c MFr Bld: 7.7 % — ABNORMAL HIGH (ref <5.7)
Mean Plasma Glucose: 174 mg/dL — ABNORMAL HIGH (ref <117)

## 2012-12-06 LAB — CBC
MCH: 27.8 pg (ref 26.0–34.0)
MCV: 90.2 fL (ref 78.0–100.0)
Platelets: 242 10*3/uL (ref 150–400)
RBC: 4.68 MIL/uL (ref 4.22–5.81)
RDW: 15.2 % (ref 11.5–15.5)
WBC: 9.9 10*3/uL (ref 4.0–10.5)

## 2012-12-07 LAB — BASIC METABOLIC PANEL
BUN: 57 mg/dL — ABNORMAL HIGH (ref 6–23)
CO2: 28 mEq/L (ref 19–32)
Calcium: 8.7 mg/dL (ref 8.4–10.5)
Creatinine, Ser: 1.14 mg/dL (ref 0.50–1.35)
Glucose, Bld: 259 mg/dL — ABNORMAL HIGH (ref 70–99)

## 2012-12-07 LAB — CBC
Hemoglobin: 13.6 g/dL (ref 13.0–17.0)
MCH: 28.3 pg (ref 26.0–34.0)
MCHC: 32 g/dL (ref 30.0–36.0)
MCV: 88.5 fL (ref 78.0–100.0)
RBC: 4.8 MIL/uL (ref 4.22–5.81)

## 2012-12-08 ENCOUNTER — Other Ambulatory Visit (HOSPITAL_COMMUNITY): Payer: Self-pay

## 2012-12-08 LAB — BASIC METABOLIC PANEL WITH GFR
BUN: 58 mg/dL — ABNORMAL HIGH (ref 6–23)
CO2: 30 meq/L (ref 19–32)
Calcium: 8.6 mg/dL (ref 8.4–10.5)
Chloride: 112 meq/L (ref 96–112)
Creatinine, Ser: 1.12 mg/dL (ref 0.50–1.35)
GFR calc Af Amer: 73 mL/min — ABNORMAL LOW
GFR calc non Af Amer: 63 mL/min — ABNORMAL LOW
Glucose, Bld: 306 mg/dL — ABNORMAL HIGH (ref 70–99)
Potassium: 3.7 meq/L (ref 3.5–5.1)
Sodium: 150 meq/L — ABNORMAL HIGH (ref 135–145)

## 2012-12-09 ENCOUNTER — Other Ambulatory Visit (HOSPITAL_COMMUNITY): Payer: Self-pay

## 2012-12-09 LAB — CBC WITH DIFFERENTIAL/PLATELET
Basophils Relative: 0 % (ref 0–1)
Eosinophils Relative: 4 % (ref 0–5)
Hemoglobin: 12.6 g/dL — ABNORMAL LOW (ref 13.0–17.0)
Lymphs Abs: 1.1 10*3/uL (ref 0.7–4.0)
MCH: 27.5 pg (ref 26.0–34.0)
MCV: 89.3 fL (ref 78.0–100.0)
Monocytes Absolute: 0.5 10*3/uL (ref 0.1–1.0)
Neutro Abs: 6 10*3/uL (ref 1.7–7.7)
RBC: 4.58 MIL/uL (ref 4.22–5.81)

## 2012-12-09 LAB — BASIC METABOLIC PANEL
BUN: 59 mg/dL — ABNORMAL HIGH (ref 6–23)
CO2: 28 mEq/L (ref 19–32)
Calcium: 8.9 mg/dL (ref 8.4–10.5)
Creatinine, Ser: 1.08 mg/dL (ref 0.50–1.35)
Glucose, Bld: 298 mg/dL — ABNORMAL HIGH (ref 70–99)

## 2012-12-10 LAB — BASIC METABOLIC PANEL
CO2: 31 mEq/L (ref 19–32)
Calcium: 9.1 mg/dL (ref 8.4–10.5)
Creatinine, Ser: 1.14 mg/dL (ref 0.50–1.35)
Glucose, Bld: 276 mg/dL — ABNORMAL HIGH (ref 70–99)
Sodium: 151 mEq/L — ABNORMAL HIGH (ref 135–145)

## 2012-12-11 LAB — COMPREHENSIVE METABOLIC PANEL
ALT: 79 U/L — ABNORMAL HIGH (ref 0–53)
Alkaline Phosphatase: 173 U/L — ABNORMAL HIGH (ref 39–117)
CO2: 30 mEq/L (ref 19–32)
Calcium: 9 mg/dL (ref 8.4–10.5)
GFR calc Af Amer: 80 mL/min — ABNORMAL LOW (ref >90)
GFR calc non Af Amer: 69 mL/min — ABNORMAL LOW (ref >90)
Glucose, Bld: 322 mg/dL — ABNORMAL HIGH (ref 70–99)
Sodium: 149 mEq/L — ABNORMAL HIGH (ref 135–145)

## 2012-12-11 LAB — CBC WITH DIFFERENTIAL/PLATELET
Basophils Absolute: 0 10*3/uL (ref 0.0–0.1)
Basophils Relative: 0 % (ref 0–1)
Eosinophils Absolute: 0.4 10*3/uL (ref 0.0–0.7)
MCH: 28.6 pg (ref 26.0–34.0)
MCHC: 32.2 g/dL (ref 30.0–36.0)
Neutrophils Relative %: 75 % (ref 43–77)
Platelets: 173 10*3/uL (ref 150–400)
RBC: 4.65 MIL/uL (ref 4.22–5.81)
RDW: 15.9 % — ABNORMAL HIGH (ref 11.5–15.5)

## 2012-12-12 LAB — BASIC METABOLIC PANEL
GFR calc Af Amer: 82 mL/min — ABNORMAL LOW (ref >90)
GFR calc non Af Amer: 70 mL/min — ABNORMAL LOW (ref >90)
Potassium: 3.9 mEq/L (ref 3.5–5.1)
Sodium: 148 mEq/L — ABNORMAL HIGH (ref 135–145)

## 2012-12-13 LAB — BASIC METABOLIC PANEL
CO2: 29 mEq/L (ref 19–32)
GFR calc non Af Amer: 80 mL/min — ABNORMAL LOW (ref >90)
Glucose, Bld: 299 mg/dL — ABNORMAL HIGH (ref 70–99)
Potassium: 3.9 mEq/L (ref 3.5–5.1)
Sodium: 146 mEq/L — ABNORMAL HIGH (ref 135–145)

## 2012-12-13 LAB — CBC
Hemoglobin: 12.8 g/dL — ABNORMAL LOW (ref 13.0–17.0)
Platelets: 155 10*3/uL (ref 150–400)
RBC: 4.61 MIL/uL (ref 4.22–5.81)

## 2012-12-14 ENCOUNTER — Other Ambulatory Visit (HOSPITAL_COMMUNITY): Payer: Self-pay

## 2012-12-14 LAB — BASIC METABOLIC PANEL
BUN: 65 mg/dL — ABNORMAL HIGH (ref 6–23)
Chloride: 107 mEq/L (ref 96–112)
GFR calc Af Amer: 82 mL/min — ABNORMAL LOW (ref >90)
Glucose, Bld: 235 mg/dL — ABNORMAL HIGH (ref 70–99)
Potassium: 3.8 mEq/L (ref 3.5–5.1)
Sodium: 148 mEq/L — ABNORMAL HIGH (ref 135–145)

## 2012-12-15 LAB — BASIC METABOLIC PANEL
BUN: 73 mg/dL — ABNORMAL HIGH (ref 6–23)
BUN: 73 mg/dL — ABNORMAL HIGH (ref 6–23)
CO2: 31 mEq/L (ref 19–32)
CO2: 32 mEq/L (ref 19–32)
Chloride: 104 mEq/L (ref 96–112)
Creatinine, Ser: 0.98 mg/dL (ref 0.50–1.35)
GFR calc non Af Amer: 72 mL/min — ABNORMAL LOW (ref >90)
Glucose, Bld: 262 mg/dL — ABNORMAL HIGH (ref 70–99)
Glucose, Bld: 253 mg/dL — ABNORMAL HIGH (ref 70–99)
Potassium: 3.7 mEq/L (ref 3.5–5.1)
Potassium: 3.3 mEq/L — ABNORMAL LOW (ref 3.5–5.1)
Sodium: 147 mEq/L — ABNORMAL HIGH (ref 135–145)

## 2012-12-15 LAB — CBC
HCT: 41.7 % (ref 39.0–52.0)
Hemoglobin: 13.5 g/dL (ref 13.0–17.0)
MCHC: 32.4 g/dL (ref 30.0–36.0)
RBC: 4.79 MIL/uL (ref 4.22–5.81)

## 2012-12-16 LAB — CBC
HCT: 42.3 % (ref 39.0–52.0)
Hemoglobin: 13.3 g/dL (ref 13.0–17.0)
MCH: 27.6 pg (ref 26.0–34.0)
MCHC: 31.4 g/dL (ref 30.0–36.0)
MCV: 87.8 fL (ref 78.0–100.0)
RDW: 16.8 % — ABNORMAL HIGH (ref 11.5–15.5)

## 2012-12-16 LAB — BASIC METABOLIC PANEL
BUN: 74 mg/dL — ABNORMAL HIGH (ref 6–23)
Creatinine, Ser: 1.01 mg/dL (ref 0.50–1.35)
GFR calc Af Amer: 82 mL/min — ABNORMAL LOW (ref >90)
GFR calc non Af Amer: 71 mL/min — ABNORMAL LOW (ref >90)
Glucose, Bld: 253 mg/dL — ABNORMAL HIGH (ref 70–99)

## 2012-12-18 LAB — CBC
Hemoglobin: 14.1 g/dL (ref 13.0–17.0)
MCH: 28.3 pg (ref 26.0–34.0)
MCHC: 31.3 g/dL (ref 30.0–36.0)
RDW: 17.9 % — ABNORMAL HIGH (ref 11.5–15.5)

## 2012-12-18 LAB — BASIC METABOLIC PANEL
BUN: 93 mg/dL — ABNORMAL HIGH (ref 6–23)
Calcium: 9.2 mg/dL (ref 8.4–10.5)
GFR calc Af Amer: 71 mL/min — ABNORMAL LOW (ref >90)
GFR calc non Af Amer: 61 mL/min — ABNORMAL LOW (ref >90)
Glucose, Bld: 264 mg/dL — ABNORMAL HIGH (ref 70–99)
Potassium: 3.8 mEq/L (ref 3.5–5.1)
Sodium: 148 mEq/L — ABNORMAL HIGH (ref 135–145)

## 2012-12-20 ENCOUNTER — Other Ambulatory Visit (HOSPITAL_COMMUNITY): Payer: Self-pay

## 2012-12-20 LAB — CBC
Hemoglobin: 14 g/dL (ref 13.0–17.0)
MCH: 27.3 pg (ref 26.0–34.0)
MCH: 28.7 pg (ref 26.0–34.0)
MCHC: 30 g/dL (ref 30.0–36.0)
MCHC: 31.5 g/dL (ref 30.0–36.0)
Platelets: 147 10*3/uL — ABNORMAL LOW (ref 150–400)
Platelets: 163 10*3/uL (ref 150–400)
RBC: 5.12 MIL/uL (ref 4.22–5.81)
RDW: 18.5 % — ABNORMAL HIGH (ref 11.5–15.5)

## 2012-12-20 LAB — PRO B NATRIURETIC PEPTIDE: Pro B Natriuretic peptide (BNP): 410.4 pg/mL — ABNORMAL HIGH (ref 0–125)

## 2012-12-20 LAB — BASIC METABOLIC PANEL
Calcium: 8.9 mg/dL (ref 8.4–10.5)
Calcium: 9.2 mg/dL (ref 8.4–10.5)
GFR calc non Af Amer: 57 mL/min — ABNORMAL LOW (ref >90)
GFR calc non Af Amer: 57 mL/min — ABNORMAL LOW (ref >90)
Potassium: 3.6 mEq/L (ref 3.5–5.1)
Sodium: 151 mEq/L — ABNORMAL HIGH (ref 135–145)
Sodium: 152 mEq/L — ABNORMAL HIGH (ref 135–145)

## 2012-12-21 LAB — CK: Total CK: 123 U/L (ref 7–232)

## 2012-12-21 LAB — TROPONIN I: Troponin I: <0.3 ng/mL (ref <0.30)

## 2012-12-22 ENCOUNTER — Other Ambulatory Visit (HOSPITAL_COMMUNITY): Payer: Self-pay

## 2012-12-22 LAB — CBC WITH DIFFERENTIAL/PLATELET
Eosinophils Relative: 2 % (ref 0–5)
HCT: 44 % (ref 39.0–52.0)
Lymphocytes Relative: 11 % — ABNORMAL LOW (ref 12–46)
Lymphs Abs: 1.6 10*3/uL (ref 0.7–4.0)
MCV: 92.1 fL (ref 78.0–100.0)
Monocytes Relative: 9 % (ref 3–12)
Platelets: 120 10*3/uL — ABNORMAL LOW (ref 150–400)
RBC: 4.78 MIL/uL (ref 4.22–5.81)
WBC: 14.1 10*3/uL — ABNORMAL HIGH (ref 4.0–10.5)

## 2012-12-22 LAB — CLOSTRIDIUM DIFFICILE BY PCR: Toxigenic C. Difficile by PCR: NEGATIVE

## 2012-12-22 LAB — BASIC METABOLIC PANEL
CO2: 38 mEq/L — ABNORMAL HIGH (ref 19–32)
Chloride: 106 mEq/L (ref 96–112)
Creatinine, Ser: 1.41 mg/dL — ABNORMAL HIGH (ref 0.50–1.35)
GFR calc Af Amer: 55 mL/min — ABNORMAL LOW (ref >90)
Potassium: 3.3 mEq/L — ABNORMAL LOW (ref 3.5–5.1)

## 2012-12-22 LAB — PROCALCITONIN: Procalcitonin: 1.87 ng/mL

## 2012-12-23 ENCOUNTER — Other Ambulatory Visit (HOSPITAL_COMMUNITY): Payer: Self-pay

## 2012-12-23 LAB — BASIC METABOLIC PANEL
BUN: 145 mg/dL — ABNORMAL HIGH (ref 6–23)
CO2: 36 mEq/L — ABNORMAL HIGH (ref 19–32)
Calcium: 8.6 mg/dL (ref 8.4–10.5)
Chloride: 109 mEq/L (ref 96–112)
Creatinine, Ser: 1.38 mg/dL — ABNORMAL HIGH (ref 0.50–1.35)
GFR calc Af Amer: 57 mL/min — ABNORMAL LOW (ref >90)
GFR calc non Af Amer: 49 mL/min — ABNORMAL LOW (ref >90)
Glucose, Bld: 222 mg/dL — ABNORMAL HIGH (ref 70–99)
Potassium: 3.4 mEq/L — ABNORMAL LOW (ref 3.5–5.1)
Sodium: 156 mEq/L — ABNORMAL HIGH (ref 135–145)

## 2012-12-23 LAB — CBC WITH DIFFERENTIAL/PLATELET
Basophils Relative: 0 % (ref 0–1)
Eosinophils Absolute: 0.3 10*3/uL (ref 0.0–0.7)
Eosinophils Relative: 2 % (ref 0–5)
HCT: 45.4 % (ref 39.0–52.0)
Hemoglobin: 13.7 g/dL (ref 13.0–17.0)
Lymphocytes Relative: 7 % — ABNORMAL LOW (ref 12–46)
Monocytes Relative: 4 % (ref 3–12)
Neutro Abs: 11.3 10*3/uL — ABNORMAL HIGH (ref 1.7–7.7)
Neutrophils Relative %: 87 % — ABNORMAL HIGH (ref 43–77)
RBC: 4.84 MIL/uL (ref 4.22–5.81)
WBC: 13 10*3/uL — ABNORMAL HIGH (ref 4.0–10.5)

## 2012-12-24 ENCOUNTER — Other Ambulatory Visit (HOSPITAL_COMMUNITY): Payer: Self-pay

## 2012-12-24 LAB — BASIC METABOLIC PANEL
BUN: 141 mg/dL — ABNORMAL HIGH (ref 6–23)
Calcium: 8.2 mg/dL — ABNORMAL LOW (ref 8.4–10.5)
GFR calc Af Amer: 56 mL/min — ABNORMAL LOW (ref >90)
GFR calc non Af Amer: 48 mL/min — ABNORMAL LOW (ref >90)
Potassium: 3.1 mEq/L — ABNORMAL LOW (ref 3.5–5.1)
Sodium: 150 mEq/L — ABNORMAL HIGH (ref 135–145)

## 2012-12-24 LAB — CULTURE, BLOOD (ROUTINE X 2)

## 2012-12-24 LAB — CBC WITH DIFFERENTIAL/PLATELET
Basophils Absolute: 0 10*3/uL (ref 0.0–0.1)
Eosinophils Absolute: 0.4 10*3/uL (ref 0.0–0.7)
Lymphocytes Relative: 10 % — ABNORMAL LOW (ref 12–46)
MCHC: 29 g/dL — ABNORMAL LOW (ref 30.0–36.0)
Monocytes Absolute: 0.6 10*3/uL (ref 0.1–1.0)
Neutro Abs: 8.5 10*3/uL — ABNORMAL HIGH (ref 1.7–7.7)
Neutrophils Relative %: 80 % — ABNORMAL HIGH (ref 43–77)
RDW: 19.4 % — ABNORMAL HIGH (ref 11.5–15.5)

## 2012-12-24 LAB — URINALYSIS, ROUTINE W REFLEX MICROSCOPIC
Ketones, ur: NEGATIVE mg/dL
Protein, ur: NEGATIVE mg/dL
Urobilinogen, UA: 0.2 mg/dL (ref 0.0–1.0)

## 2012-12-24 LAB — CULTURE, RESPIRATORY W GRAM STAIN

## 2012-12-24 LAB — URINE MICROSCOPIC-ADD ON

## 2012-12-25 ENCOUNTER — Other Ambulatory Visit (HOSPITAL_COMMUNITY): Payer: Self-pay

## 2012-12-25 LAB — COMPREHENSIVE METABOLIC PANEL
ALT: 116 U/L — ABNORMAL HIGH (ref 0–53)
Alkaline Phosphatase: 182 U/L — ABNORMAL HIGH (ref 39–117)
BUN: 116 mg/dL — ABNORMAL HIGH (ref 6–23)
CO2: 35 mEq/L — ABNORMAL HIGH (ref 19–32)
Chloride: 100 mEq/L (ref 96–112)
GFR calc Af Amer: 78 mL/min — ABNORMAL LOW (ref >90)
Glucose, Bld: 663 mg/dL (ref 70–99)
Potassium: 2.8 mEq/L — ABNORMAL LOW (ref 3.5–5.1)
Sodium: 143 mEq/L (ref 135–145)
Total Bilirubin: 0.3 mg/dL (ref 0.3–1.2)

## 2012-12-25 LAB — VANCOMYCIN, TROUGH: Vancomycin Tr: 19.9 ug/mL (ref 10.0–20.0)

## 2012-12-25 LAB — CBC WITH DIFFERENTIAL/PLATELET
Hemoglobin: 11.2 g/dL — ABNORMAL LOW (ref 13.0–17.0)
Lymphocytes Relative: 14 % (ref 12–46)
Lymphs Abs: 1.2 10*3/uL (ref 0.7–4.0)
MCH: 27.6 pg (ref 26.0–34.0)
Monocytes Relative: 6 % (ref 3–12)
Neutro Abs: 6.3 10*3/uL (ref 1.7–7.7)
Neutrophils Relative %: 75 % (ref 43–77)
Platelets: 111 10*3/uL — ABNORMAL LOW (ref 150–400)
RBC: 4.06 MIL/uL — ABNORMAL LOW (ref 4.22–5.81)
WBC: 8.4 10*3/uL (ref 4.0–10.5)

## 2012-12-25 LAB — MAGNESIUM: Magnesium: 3 mg/dL — ABNORMAL HIGH (ref 1.5–2.5)

## 2012-12-25 LAB — BASIC METABOLIC PANEL
BUN: 123 mg/dL — ABNORMAL HIGH (ref 6–23)
CO2: 37 mEq/L — ABNORMAL HIGH (ref 19–32)
Chloride: 112 mEq/L (ref 96–112)
Creatinine, Ser: 1.15 mg/dL (ref 0.50–1.35)
GFR calc Af Amer: 71 mL/min — ABNORMAL LOW (ref >90)
Potassium: 3.2 mEq/L — ABNORMAL LOW (ref 3.5–5.1)

## 2012-12-25 LAB — PREALBUMIN: Prealbumin: 15.8 mg/dL — ABNORMAL LOW (ref 17.0–34.0)

## 2012-12-26 LAB — URINE CULTURE

## 2012-12-26 LAB — BASIC METABOLIC PANEL
Chloride: 115 mEq/L — ABNORMAL HIGH (ref 96–112)
GFR calc Af Amer: 81 mL/min — ABNORMAL LOW (ref >90)
GFR calc non Af Amer: 69 mL/min — ABNORMAL LOW (ref >90)
Potassium: 3.4 mEq/L — ABNORMAL LOW (ref 3.5–5.1)
Sodium: 158 mEq/L — ABNORMAL HIGH (ref 135–145)

## 2012-12-27 LAB — CBC
HCT: 41.9 % (ref 39.0–52.0)
Hemoglobin: 12.5 g/dL — ABNORMAL LOW (ref 13.0–17.0)
RBC: 4.52 MIL/uL (ref 4.22–5.81)
WBC: 9.3 10*3/uL (ref 4.0–10.5)

## 2012-12-27 LAB — BASIC METABOLIC PANEL
BUN: 91 mg/dL — ABNORMAL HIGH (ref 6–23)
CO2: 32 mEq/L (ref 19–32)
Chloride: 116 mEq/L — ABNORMAL HIGH (ref 96–112)
GFR calc non Af Amer: 69 mL/min — ABNORMAL LOW (ref >90)
Glucose, Bld: 194 mg/dL — ABNORMAL HIGH (ref 70–99)
Potassium: 3.4 mEq/L — ABNORMAL LOW (ref 3.5–5.1)
Sodium: 159 mEq/L — ABNORMAL HIGH (ref 135–145)

## 2012-12-28 LAB — CULTURE, BLOOD (ROUTINE X 2): Culture: NO GROWTH

## 2012-12-28 LAB — BASIC METABOLIC PANEL
BUN: 85 mg/dL — ABNORMAL HIGH (ref 6–23)
CO2: 34 mEq/L — ABNORMAL HIGH (ref 19–32)
Chloride: 114 mEq/L — ABNORMAL HIGH (ref 96–112)
GFR calc Af Amer: 76 mL/min — ABNORMAL LOW (ref >90)
Glucose, Bld: 142 mg/dL — ABNORMAL HIGH (ref 70–99)
Potassium: 3.5 mEq/L (ref 3.5–5.1)

## 2012-12-29 ENCOUNTER — Other Ambulatory Visit (HOSPITAL_COMMUNITY): Payer: Self-pay

## 2012-12-29 LAB — BASIC METABOLIC PANEL
BUN: 80 mg/dL — ABNORMAL HIGH (ref 6–23)
Chloride: 119 mEq/L — ABNORMAL HIGH (ref 96–112)
GFR calc Af Amer: 71 mL/min — ABNORMAL LOW (ref >90)
GFR calc non Af Amer: 61 mL/min — ABNORMAL LOW (ref >90)
Potassium: 3.8 mEq/L (ref 3.5–5.1)
Sodium: 163 mEq/L (ref 135–145)

## 2012-12-29 LAB — CBC WITH DIFFERENTIAL/PLATELET
Basophils Relative: 1 % (ref 0–1)
Eosinophils Absolute: 0.3 10*3/uL (ref 0.0–0.7)
HCT: 41.3 % (ref 39.0–52.0)
Hemoglobin: 11.7 g/dL — ABNORMAL LOW (ref 13.0–17.0)
Lymphocytes Relative: 13 % (ref 12–46)
Lymphs Abs: 1.3 10*3/uL (ref 0.7–4.0)
MCHC: 28.3 g/dL — ABNORMAL LOW (ref 30.0–36.0)
MCV: 95.8 fL (ref 78.0–100.0)
Neutro Abs: 7.9 10*3/uL — ABNORMAL HIGH (ref 1.7–7.7)

## 2012-12-30 LAB — BASIC METABOLIC PANEL
Chloride: 115 mEq/L — ABNORMAL HIGH (ref 96–112)
GFR calc Af Amer: >90 mL/min (ref >90)
GFR calc non Af Amer: 79 mL/min — ABNORMAL LOW (ref >90)
Glucose, Bld: 167 mg/dL — ABNORMAL HIGH (ref 70–99)
Potassium: 3.3 mEq/L — ABNORMAL LOW (ref 3.5–5.1)
Sodium: 156 mEq/L — ABNORMAL HIGH (ref 135–145)

## 2012-12-30 LAB — CBC
Hemoglobin: 11 g/dL — ABNORMAL LOW (ref 13.0–17.0)
MCHC: 29.9 g/dL — ABNORMAL LOW (ref 30.0–36.0)
WBC: 7.7 10*3/uL (ref 4.0–10.5)

## 2012-12-31 LAB — MAGNESIUM: Magnesium: 2.6 mg/dL — ABNORMAL HIGH (ref 1.5–2.5)

## 2012-12-31 LAB — CULTURE, BLOOD (ROUTINE X 2)

## 2012-12-31 LAB — BASIC METABOLIC PANEL
CO2: 32 mEq/L (ref 19–32)
Chloride: 117 mEq/L — ABNORMAL HIGH (ref 96–112)
GFR calc Af Amer: >90 mL/min (ref >90)
Potassium: 3.7 mEq/L (ref 3.5–5.1)

## 2013-01-01 ENCOUNTER — Other Ambulatory Visit (HOSPITAL_COMMUNITY): Payer: Self-pay

## 2013-01-01 ENCOUNTER — Encounter: Payer: Medicare Other | Admitting: *Deleted

## 2013-01-01 LAB — BASIC METABOLIC PANEL
CO2: 32 mEq/L (ref 19–32)
Chloride: 119 mEq/L — ABNORMAL HIGH (ref 96–112)
Potassium: 3.1 mEq/L — ABNORMAL LOW (ref 3.5–5.1)
Sodium: 157 mEq/L — ABNORMAL HIGH (ref 135–145)

## 2013-01-01 LAB — CBC
Platelets: 134 10*3/uL — ABNORMAL LOW (ref 150–400)
RBC: 3.93 MIL/uL — ABNORMAL LOW (ref 4.22–5.81)
WBC: 6 10*3/uL (ref 4.0–10.5)

## 2013-01-01 LAB — CULTURE, BLOOD (ROUTINE X 2): Culture: NO GROWTH

## 2013-01-02 LAB — BASIC METABOLIC PANEL
BUN: 42 mg/dL — ABNORMAL HIGH (ref 6–23)
CO2: 29 mEq/L (ref 19–32)
Chloride: 116 mEq/L — ABNORMAL HIGH (ref 96–112)
Creatinine, Ser: 0.72 mg/dL (ref 0.50–1.35)

## 2013-01-02 LAB — CBC
HCT: 35.9 % — ABNORMAL LOW (ref 39.0–52.0)
Hemoglobin: 11.2 g/dL — ABNORMAL LOW (ref 13.0–17.0)
MCV: 90.7 fL (ref 78.0–100.0)
Platelets: ADEQUATE 10*3/uL (ref 150–400)
RBC: 3.96 MIL/uL — ABNORMAL LOW (ref 4.22–5.81)
WBC: 10.3 10*3/uL (ref 4.0–10.5)

## 2013-01-03 ENCOUNTER — Encounter: Payer: Self-pay | Admitting: *Deleted

## 2013-01-03 LAB — BASIC METABOLIC PANEL
BUN: 37 mg/dL — ABNORMAL HIGH (ref 6–23)
Chloride: 112 mEq/L (ref 96–112)
Creatinine, Ser: 0.79 mg/dL (ref 0.50–1.35)
GFR calc Af Amer: >90 mL/min (ref >90)

## 2013-01-03 LAB — CBC
HCT: 36.7 % — ABNORMAL LOW (ref 39.0–52.0)
MCV: 90 fL (ref 78.0–100.0)
RDW: 20.2 % — ABNORMAL HIGH (ref 11.5–15.5)
WBC: 4.6 10*3/uL (ref 4.0–10.5)

## 2013-01-15 DEATH — deceased

## 2013-08-01 ENCOUNTER — Encounter: Payer: Self-pay | Admitting: Internal Medicine

## 2013-12-28 ENCOUNTER — Telehealth: Payer: Self-pay | Admitting: Internal Medicine

## 2013-12-28 ENCOUNTER — Encounter: Payer: Self-pay | Admitting: Internal Medicine

## 2013-12-28 NOTE — Telephone Encounter (Signed)
12-28-13 SENT PAST DUE LETTER, NEEDS PACER CK WITH ALLRED/MT

## 2015-05-29 IMAGING — CT CT HEAD W/O CM
2 series · 16 of 30 positions shown, 20 images · non-contrast
Comparison: CT 11/14/2012.

CLINICAL DATA: Post procedural CT.

CT HEAD WITHOUT CONTRAST
TECHNIQUE: Contiguous axial images were obtained from the base of
the skull through the vertex without contrast.

[Series 2: head w/o · axial · non-contrast · 0.49mm/px · z∈[+93,+238]mm · 13 of 35 slices shown, 17 images]
[im 3/35  brain]
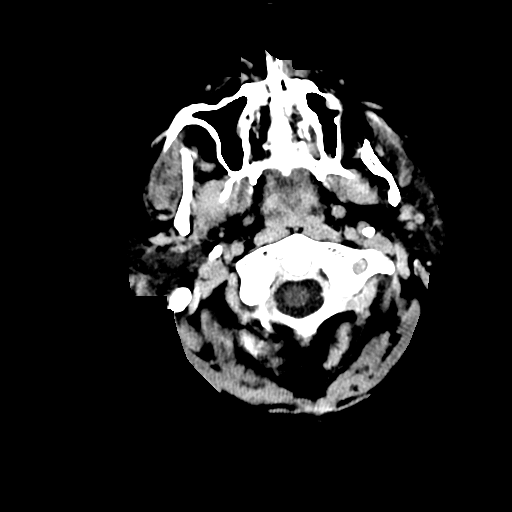
[im 3/35  bone]
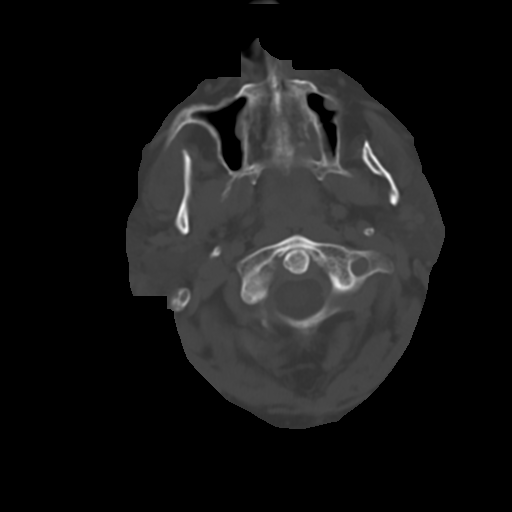
[im 5/35  brain]
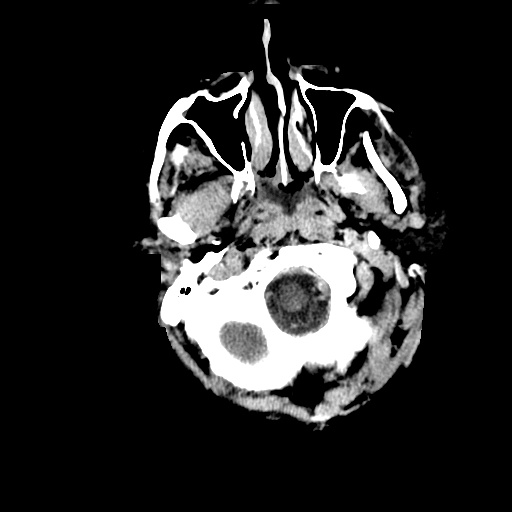
[im 8/35  brain]
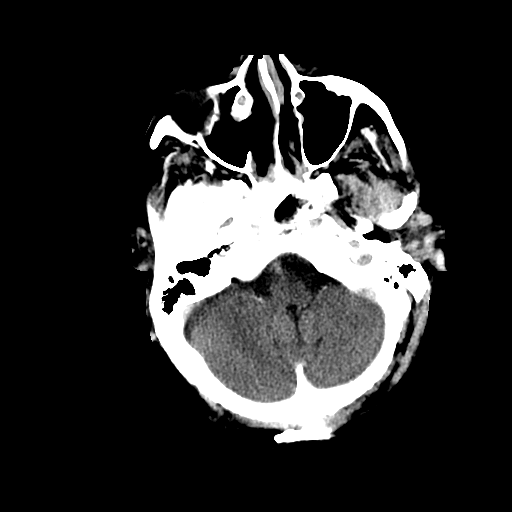
[im 10/35  brain]
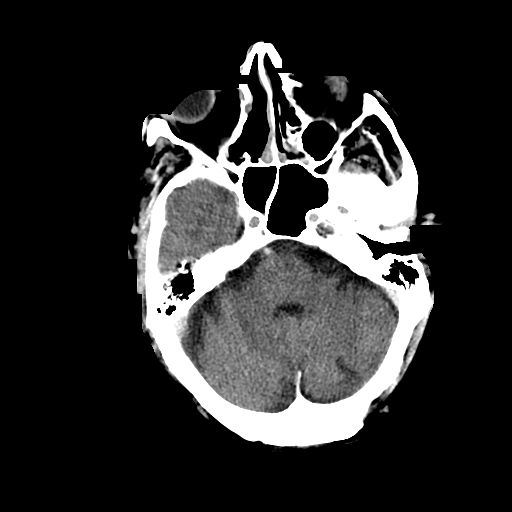
[im 13/35  brain]
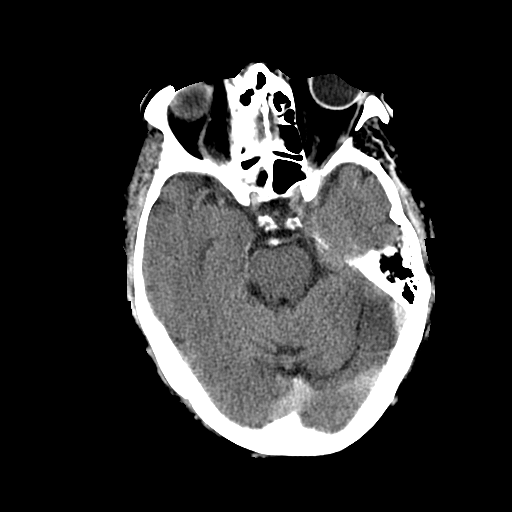
[im 13/35  bone]
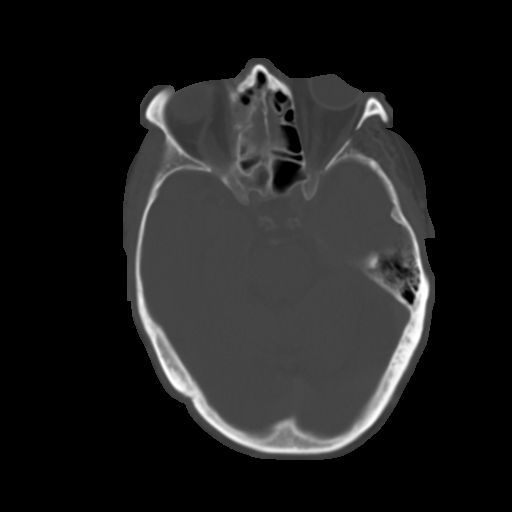
[im 15/35  brain]
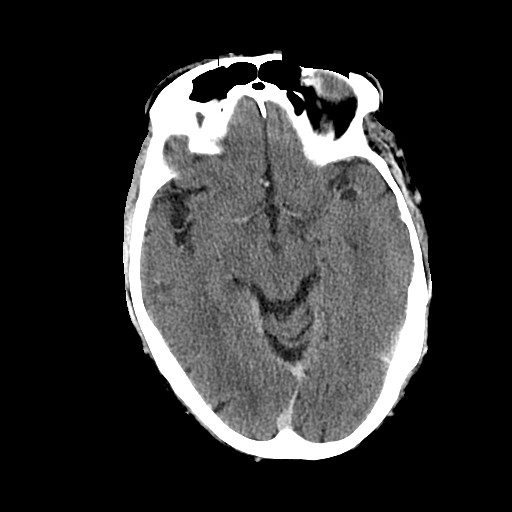
[im 18/35  brain]
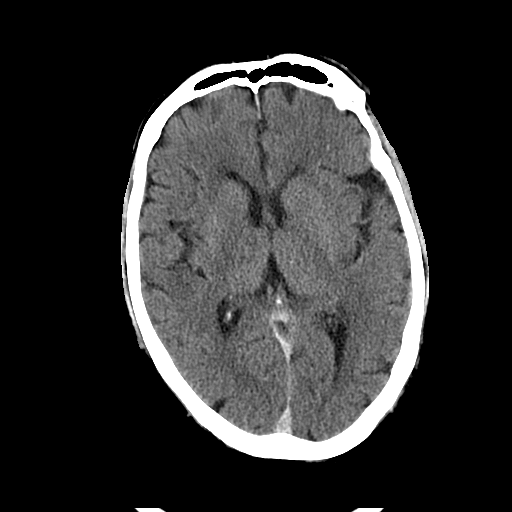
[im 20/35  brain]
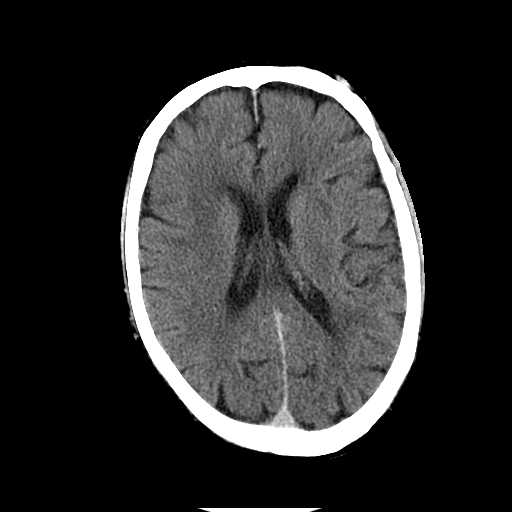
[im 22/35  brain]
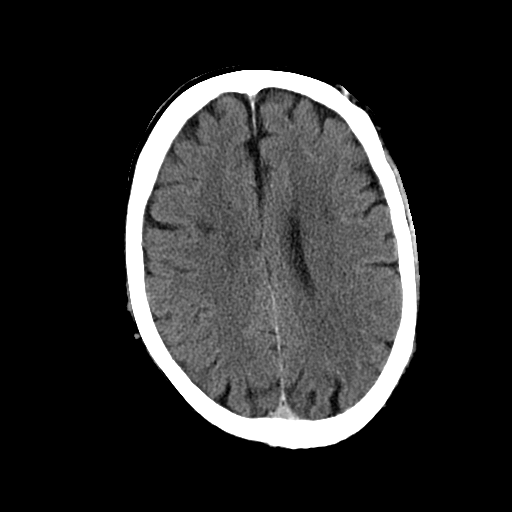
[im 22/35  bone]
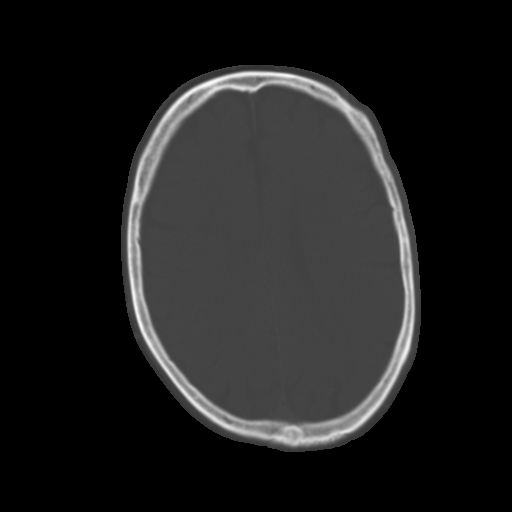
[im 25/35  brain]
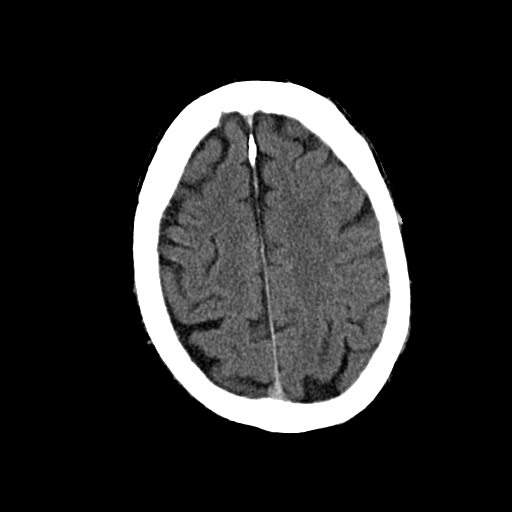
[im 27/35  brain]
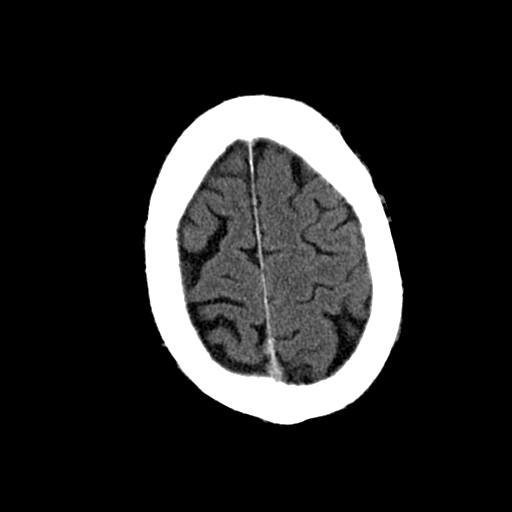
[im 30/35  brain]
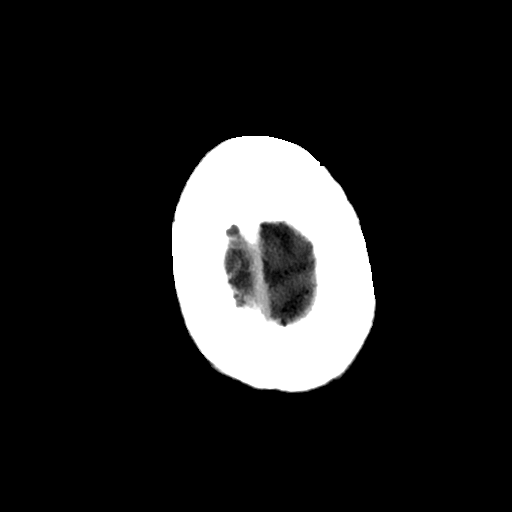
[im 32/35  brain]
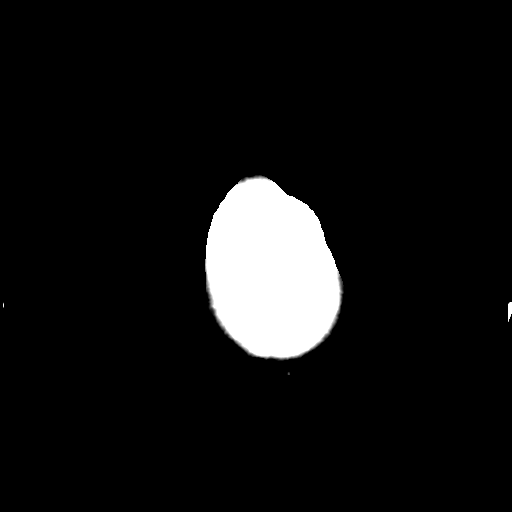
[im 32/35  bone]
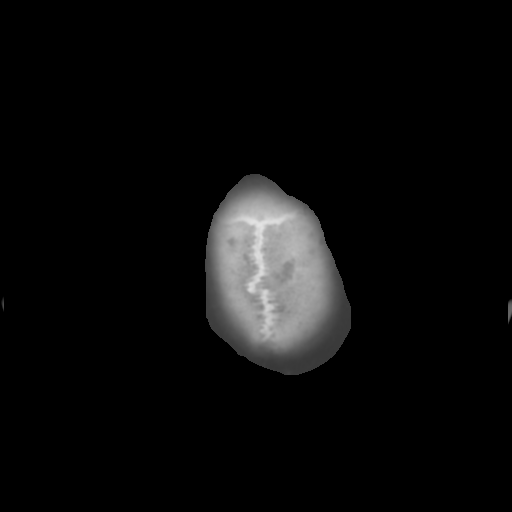

[Series 3: head w/o bone · axial · non-contrast · 0.49mm/px · z∈[+93,+143]mm · 3 of 35 slices shown]
[im 3/35  bone]
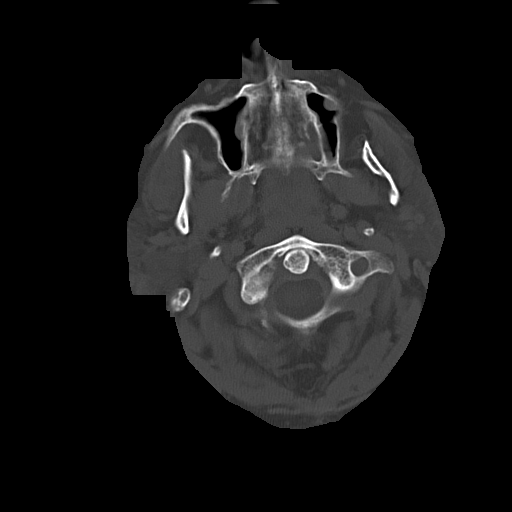
[im 8/35  bone]
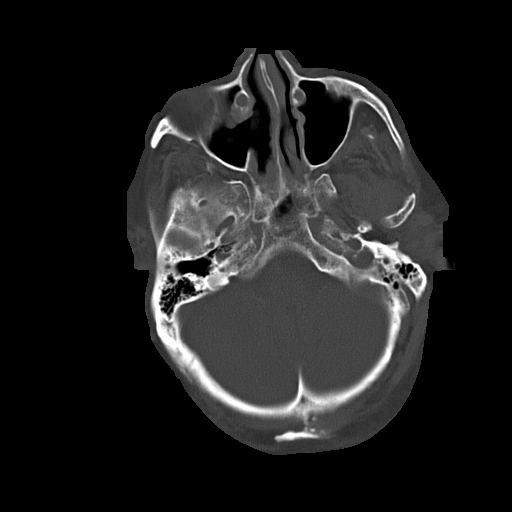
[im 13/35  bone]
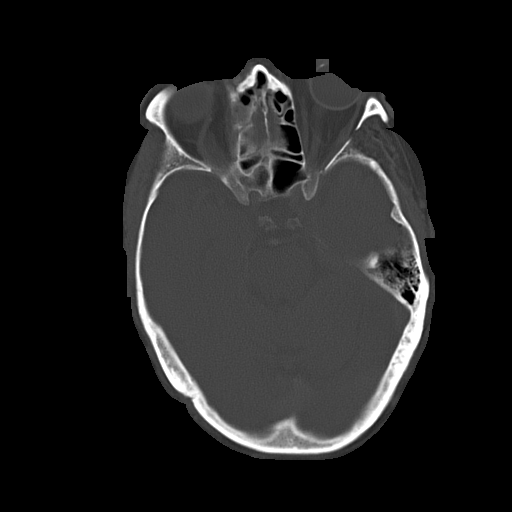

[16 of 30 positions shown; findings below may reference images not displayed]

FINDINGS: There is diffuse high attenuation of the intravascular
compartment compatible with recent angiography and prior contrast
administration.  Scattered tiny areas of supratentorial low
attenuation are again noted which may be chronic ischemic or
represent old lacunar infarcts.  There is no hemorrhage.  No mass
lesion, mass effect, midline shift, hydrocephalus.  The gray-white
differentiation is preserved.  No vascular coils are identified.
Chronic paranasal sinus disease with right maxillary antrectomy.
IMPRESSION: No interval change or complicating features following angiography.

## 2015-05-30 IMAGING — CT CT HEAD W/O CM
1 series · 15 of 30 positions shown, 19 images · non-contrast
Comparison: 11/15/2012.  11/14/2012.

CLINICAL DATA: Worsening neurological status.  Acute infarction.

CT HEAD WITHOUT CONTRAST
TECHNIQUE: Contiguous axial images were obtained from the base of
the skull through the vertex without contrast.

[Series 2: head 5.0 h30s · axial · 0.46mm/px · z∈[+669,+814]mm · 15 of 33 slices shown, 19 images]
[im 2/33  brain]
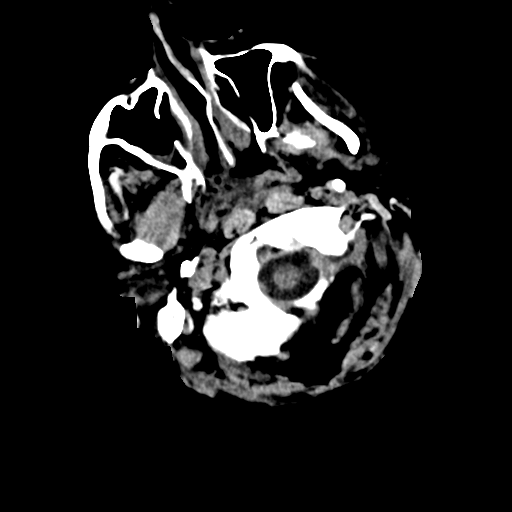
[im 2/33  bone]
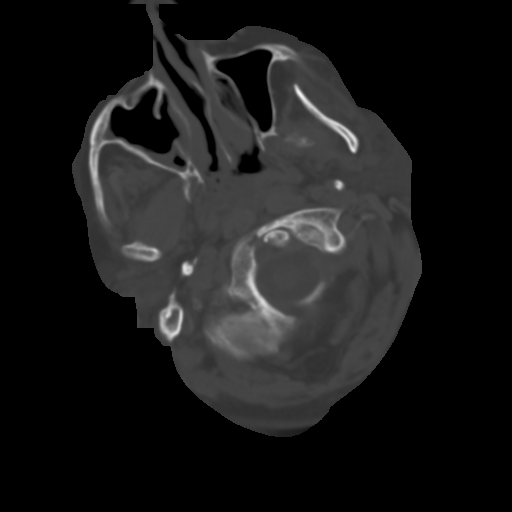
[im 4/33  brain]
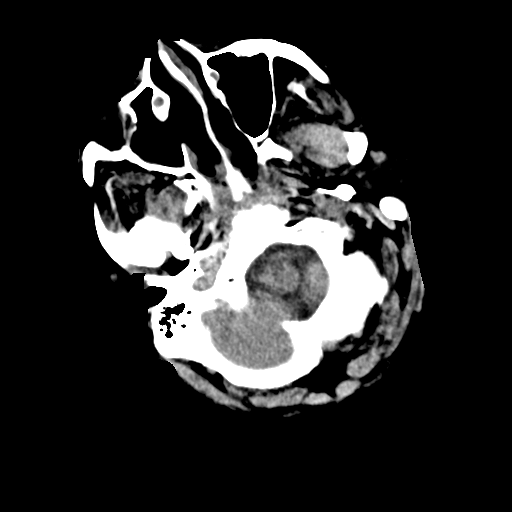
[im 6/33  brain]
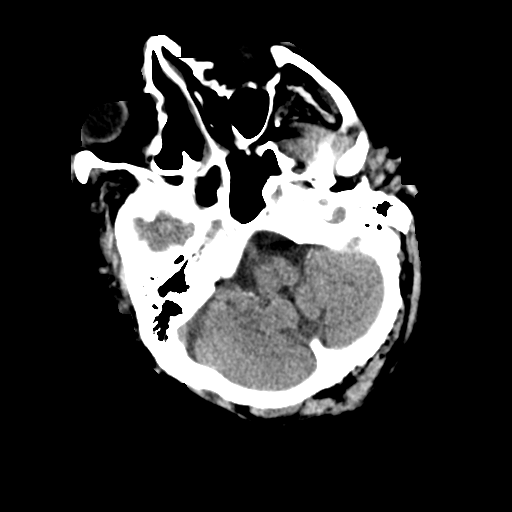
[im 8/33  brain]
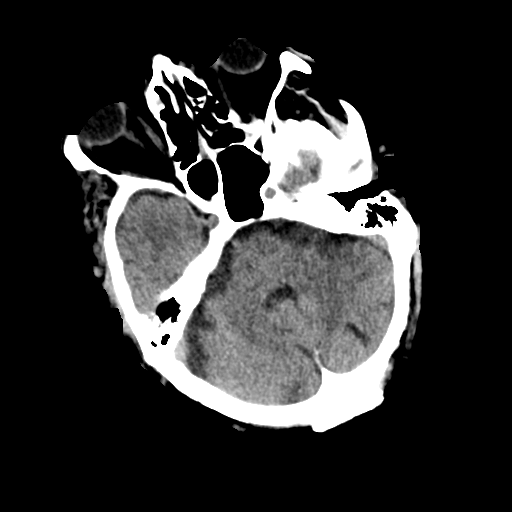
[im 10/33  brain]
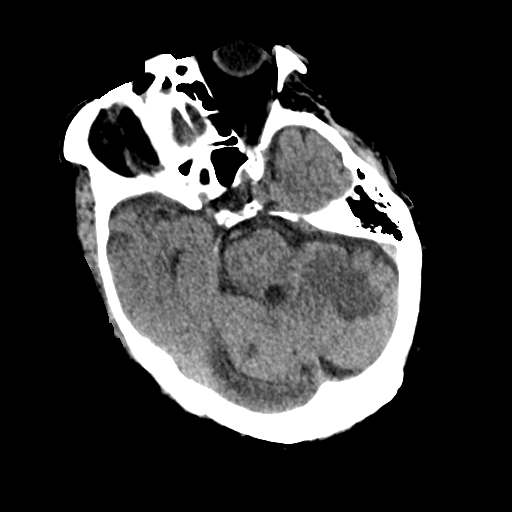
[im 10/33  bone]
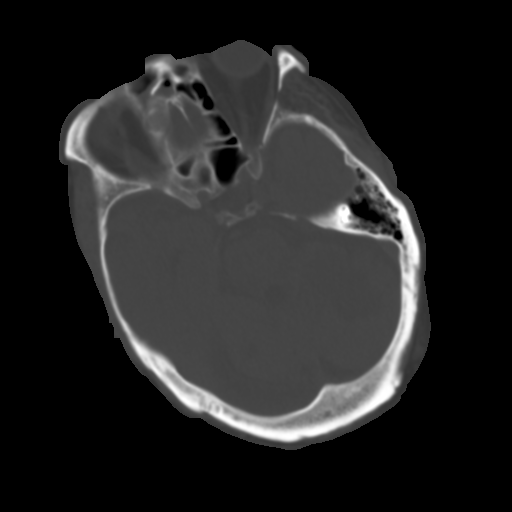
[im 13/33  brain]
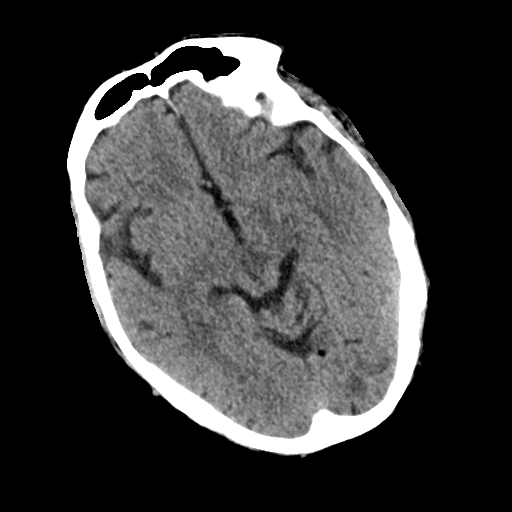
[im 15/33  brain]
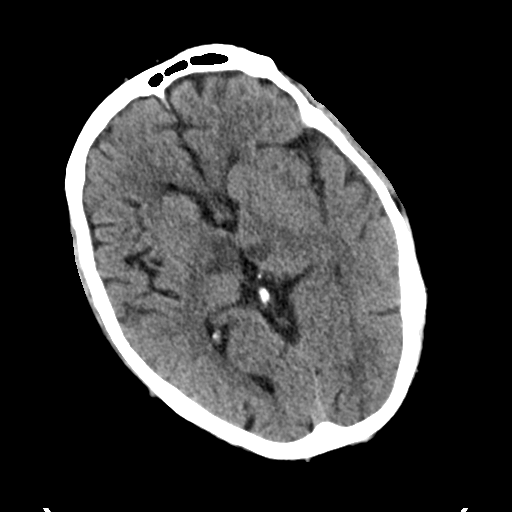
[im 17/33  brain]
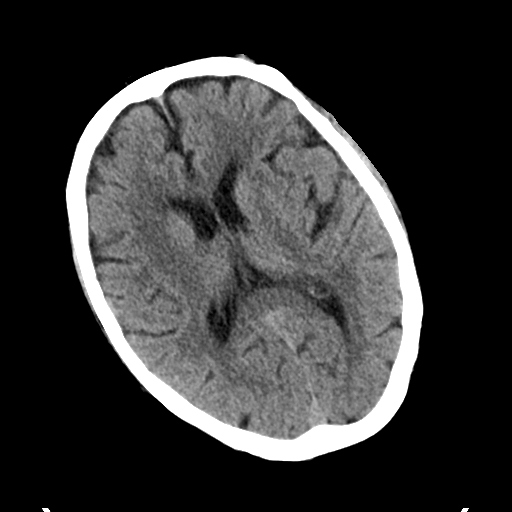
[im 18/33  brain]
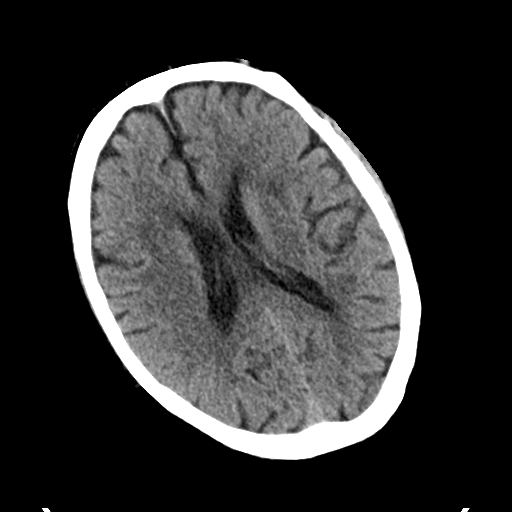
[im 18/33  bone]
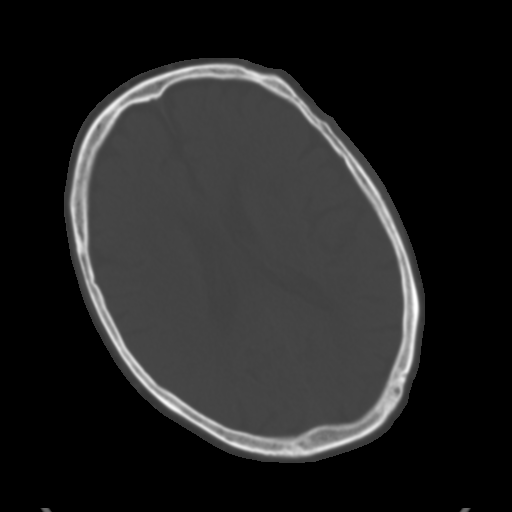
[im 20/33  brain]
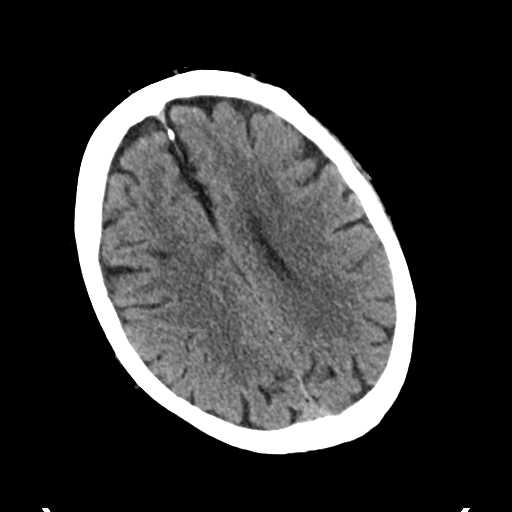
[im 23/33  brain]
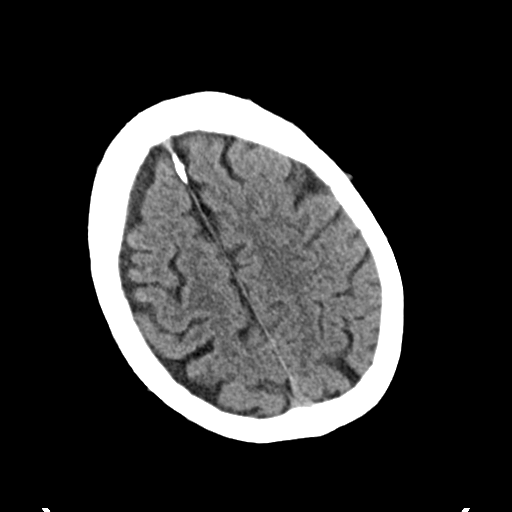
[im 25/33  brain]
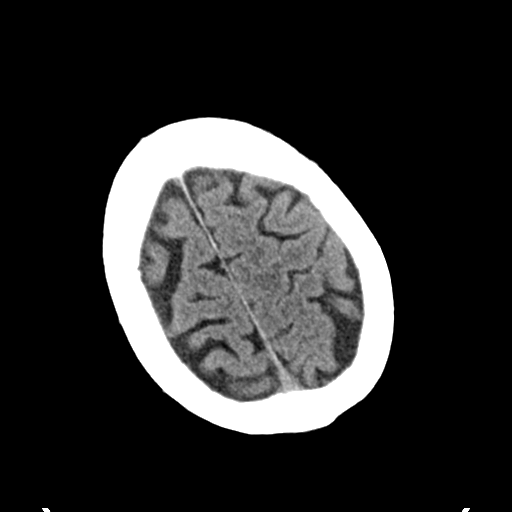
[im 27/33  brain]
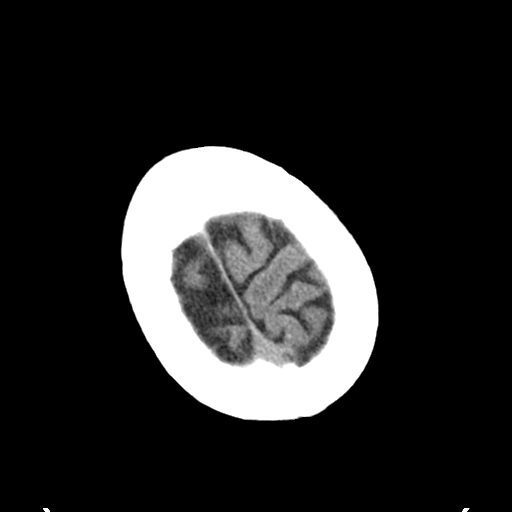
[im 27/33  bone]
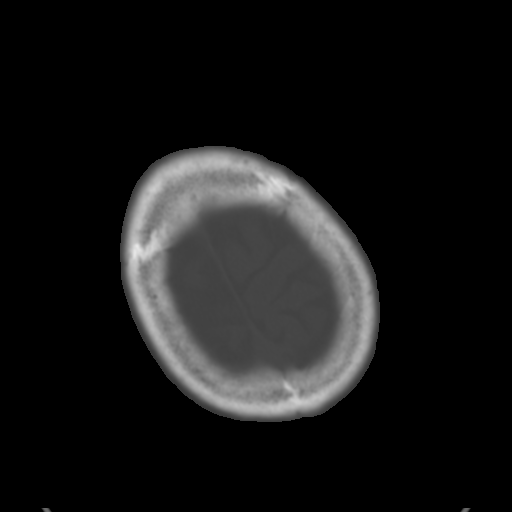
[im 29/33  brain]
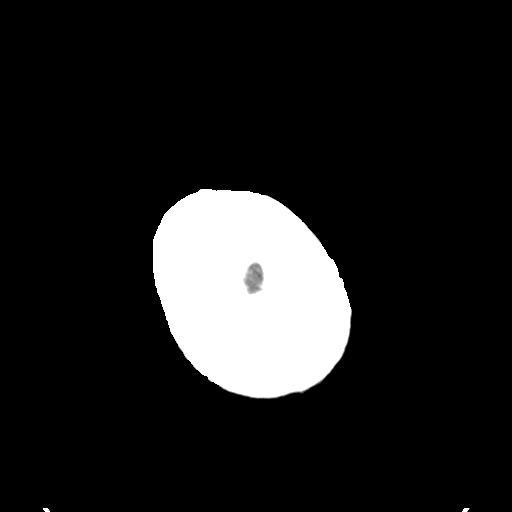
[im 31/33  brain]
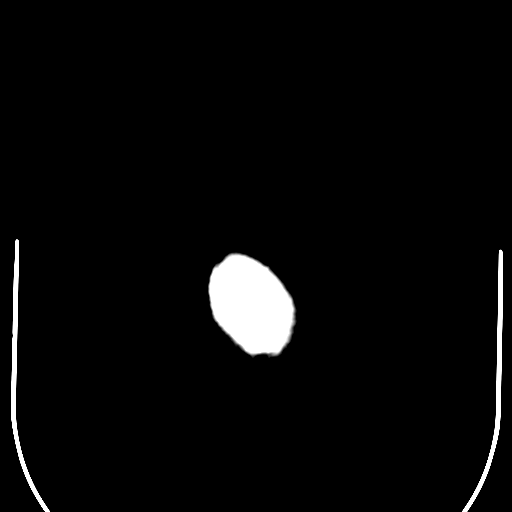

[15 of 30 positions shown; findings below may reference images not displayed]

FINDINGS: There is a low density in the cerebellum more extensive
on the left than the right consistent with acute infarction do not
appear any more extensive than they did yesterday.  No evidence of
developing hemorrhage.  No mass effect on the fourth ventricle.
Low density in the mid brain and thalami consistent with acute
infarction in that area is more noticeable.  No hemorrhage.  The
cerebral hemispheres continues show atrophy with chronic small
vessel disease. There is possibly some low density in the left
occipital region consistent with acute infarction and that portion
of the left PCA territory as well.  No hydrocephalus.  No extra-
axial collection.
IMPRESSION: The low densities related to posterior circulation infarctions are
becoming more noticeable, but not apparently enlarging.  There is
no hemorrhage or significant mass effect/shift.  The areas of
involvement include the cerebellum (more extensive on the left than
the right, the mid brain and thalami (more extensive on the right
than the left) in the left occipital cortical and subcortical
region.

## 2019-05-13 ENCOUNTER — Encounter: Payer: Self-pay | Admitting: Cardiology
